# Patient Record
Sex: Male | Born: 1970 | Race: White | Hispanic: No | Smoking: Never smoker
Health system: Southern US, Community
[De-identification: ages and names within clinical notes are randomized; demographics above are authoritative.]

## PROBLEM LIST (undated history)

## (undated) DIAGNOSIS — J324 Chronic pansinusitis: Secondary | ICD-10-CM

## (undated) DIAGNOSIS — J342 Deviated nasal septum: Secondary | ICD-10-CM

## (undated) DIAGNOSIS — K219 Gastro-esophageal reflux disease without esophagitis: Secondary | ICD-10-CM

## (undated) HISTORY — DX: Gastro-esophageal reflux disease without esophagitis: K21.9

## (undated) HISTORY — DX: Chronic pansinusitis: J32.4

## (undated) HISTORY — DX: Deviated nasal septum: J34.2

## (undated) HISTORY — PX: SHOULDER SURGERY: SHX246

---

## 2005-05-29 ENCOUNTER — Ambulatory Visit: Payer: Self-pay | Admitting: Internal Medicine

## 2006-03-02 ENCOUNTER — Encounter: Admission: RE | Admit: 2006-03-02 | Discharge: 2006-03-02 | Payer: Self-pay | Admitting: Internal Medicine

## 2006-03-04 ENCOUNTER — Encounter: Admission: RE | Admit: 2006-03-04 | Discharge: 2006-03-04 | Payer: Self-pay | Admitting: Internal Medicine

## 2009-10-19 ENCOUNTER — Encounter: Payer: Self-pay | Admitting: Internal Medicine

## 2010-05-24 NOTE — Letter (Signed)
Summary: [facility]  [facility]   Imported By: Sherian Rein 10/21/2009 14:33:05  _____________________________________________________________________  External Attachment:    Type:   Image     Comment:   External Document

## 2018-06-11 ENCOUNTER — Other Ambulatory Visit: Payer: Self-pay | Admitting: Otolaryngology

## 2018-06-11 DIAGNOSIS — J329 Chronic sinusitis, unspecified: Secondary | ICD-10-CM

## 2018-06-12 ENCOUNTER — Ambulatory Visit
Admission: EM | Admit: 2018-06-12 | Discharge: 2018-06-12 | Disposition: A | Discharge status: Home / Self Care | Payer: BLUE CROSS/BLUE SHIELD | Attending: Family Medicine | Admitting: Family Medicine

## 2018-06-12 DIAGNOSIS — M940 Chondrocostal junction syndrome [Tietze]: Secondary | ICD-10-CM

## 2018-06-12 DIAGNOSIS — R079 Chest pain, unspecified: Secondary | ICD-10-CM | POA: Diagnosis not present

## 2018-06-12 DIAGNOSIS — R1013 Epigastric pain: Secondary | ICD-10-CM

## 2018-06-12 MED ORDER — DICLOFENAC SODIUM 75 MG PO TBEC: 75.0000 mg | DELAYED_RELEASE_TABLET | Freq: Two times a day (BID) | ORAL | 0 refills | Status: DC

## 2018-06-12 NOTE — ED Triage Notes (Signed)
Pt c/o chest pain x1 wk after heavy lifting, c/o center chest burning some now with relief after belching.

## 2018-06-13 NOTE — ED Provider Notes (Signed)
[place]   [id number] 06/12/18 Arrival Time: 2353  ASSESSMENT & PLAN:  1. Chest pain, unspecified type   2. Costochondritis   3. Dyspepsia    No suspicion for cardiac etiology of current symptoms. Discussed.  ECG: Performed today and interpreted by me: normal EKG, normal sinus rhythm. No STEMI.  Trial of: Meds ordered this encounter  Medications  . diclofenac (VOLTAREN) 75 MG EC tablet    Sig: Take 1 tablet (75 mg total) by mouth 2 (two) times daily.    Dispense:  14 tablet    Refill:  0   Recommend taking OTC Pepcid 20mg  BID for the next 1-2 weeks if his dyspeptic symptoms worsen. Not bothering him too much now.  Chest pain precautions discussed. Reviewed expectations re: course of current medical issues. Questions answered. Outlined signs and symptoms indicating need for more acute intervention. Patient verbalized understanding. After Visit Summary given.   SUBJECTIVE: History from: patient.  Seth Gonzales is a 48 y.o. male who presents with complaint of intermittent anterior chest discomfort described as dull with occasional sharp exacerbations that does not radiate. Onset gradual, over the past week with unchanged course since that time. Has been doing renovation work at his house; much lifting; questions strain of chest wall. No trauma. Rates as 3/10 in intensity when present; without associated n/v; without associated SOB. Occasional epigastric discomfort after eating. Resolves after belching. This is a long standing problem but doesn't bother him too much. Typical duration of symptoms when present: seconds then resolves. Denies: chest pressure/discomfort, claudication, dyspnea, exertional chest pressure/discomfort, fatigue, irregular heart beat, lower extremity edema, near-syncope, orthopnea, palpitations, paroxysmal nocturnal dyspnea, syncope and tachypnea. Aggravating factors: include certain movements of torso. Alleviating factors: rest. Recent  illnesses: none. Fever: absent. Ambulatory without assistance. Self/OTC treatment: none reported. History of similar: no. Illicit drug use: none. Reports that he has never smoked. He has never used smokeless tobacco.  ROS: As per HPI. All other systems negative.   OBJECTIVE:  Vitals:   06/12/18 1716  BP: (!) 151/83  Pulse: 94  Resp: 18  Temp: 97.9 F (36.6 C)  TempSrc: Oral  SpO2: 98%    General appearance: alert, oriented, no acute distress Eyes: PERRLA; EOMI; conjunctivae normal HENT: normocephalic; atraumatic Neck: supple with FROM Lungs: without labored respirations; CTAB Heart: regular rate and rhythm without murmer Chest Wall: with mild bilateral upper tenderness to palpation Abdomen: soft, non-tender; bowel sounds normal; no masses or organomegaly; no guarding or rebound tenderness Extremities: without edema; without calf swelling or tenderness; symmetrical without gross deformities Skin: warm and dry; without rash or lesions Psychological: alert and cooperative; normal mood and affect  Allergies  Allergen Reactions  . Penicillins   . Sulfa Antibiotics     PMH: GERD.  Social History   Socioeconomic History  . Marital status: Married    Spouse name: Not on file  . Number of children: Not on file  . Years of education: Not on file  . Highest education level: Not on file  Occupational History  . Not on file  Social Needs  . Financial resource strain: Not on file  . Food insecurity:    Worry: Not on file    Inability: Not on file  . Transportation needs:    Medical: Not on file    Non-medical: Not on file  Tobacco Use  . Smoking status: Never Smoker  . Smokeless tobacco: Never Used  Substance and Sexual Activity  . Alcohol use: Yes  .  Drug use: Not on file  . Sexual activity: Not on file  Lifestyle  . Physical activity:    Days per week: Not on file    Minutes per session: Not on file  . Stress: Not on file  Relationships  . Social  connections:    Talks on phone: Not on file    Gets together: Not on file    Attends religious service: Not on file    Active member of club or organization: Not on file    Attends meetings of clubs or organizations: Not on file    Relationship status: Not on file  . Intimate partner violence:    Fear of current or ex partner: Not on file    Emotionally abused: Not on file    Physically abused: Not on file    Forced sexual activity: Not on file  Other Topics Concern  . Not on file  Social History Narrative  . Not on file   FH: Question of HTN.  History reviewed. No pertinent surgical history.   Vanessa Kick, MD 06/18/18 0900

## 2018-06-14 ENCOUNTER — Ambulatory Visit
Admission: RE | Admit: 2018-06-14 | Discharge: 2018-06-14 | Disposition: A | Discharge status: Home / Self Care | Payer: BLUE CROSS/BLUE SHIELD | Source: Ambulatory Visit | Attending: Otolaryngology | Admitting: Otolaryngology

## 2018-06-14 DIAGNOSIS — J329 Chronic sinusitis, unspecified: Secondary | ICD-10-CM

## 2019-02-22 ENCOUNTER — Encounter (INDEPENDENT_AMBULATORY_CARE_PROVIDER_SITE_OTHER): Payer: Self-pay

## 2019-10-28 ENCOUNTER — Other Ambulatory Visit: Payer: Self-pay

## 2019-10-28 ENCOUNTER — Encounter (INDEPENDENT_AMBULATORY_CARE_PROVIDER_SITE_OTHER): Payer: Self-pay | Admitting: Otolaryngology

## 2019-10-28 ENCOUNTER — Ambulatory Visit (INDEPENDENT_AMBULATORY_CARE_PROVIDER_SITE_OTHER): Payer: Self-pay | Admitting: Otolaryngology

## 2019-10-28 VITALS — Temp 97.5°F

## 2019-10-28 DIAGNOSIS — J343 Hypertrophy of nasal turbinates: Secondary | ICD-10-CM

## 2019-10-28 DIAGNOSIS — J342 Deviated nasal septum: Secondary | ICD-10-CM

## 2019-10-28 DIAGNOSIS — J324 Chronic pansinusitis: Secondary | ICD-10-CM

## 2019-10-28 NOTE — Progress Notes (Signed)
HPI: Seth Gonzales is a 49 y.o. male who returns today for evaluation of sinuses.  He was previously scheduled for sinus surgery, septoplasty and turbinate reductions in April of last year when Covid hit and surgery was canceled.  He presents today to discuss rescheduling surgery.  His main complaints seem to be chronic drainage as well as some pressure between the eyes and in the head as well as nasal congestion especially at night when he lies down. His previous CT scan of the sinuses was performed in February of last year.Marland Kitchen He is otherwise healthy and on no medications. He has allergies to sulfa medication that causes a rash as well as penicillin.  In the past he has been treated with clindamycin as well as vancomycin for surgeries.  No past medical history on file. No past surgical history on file. Social History   Socioeconomic History  . Marital status: Married    Spouse name: Not on file  . Number of children: Not on file  . Years of education: Not on file  . Highest education level: Not on file  Occupational History  . Not on file  Tobacco Use  . Smoking status: Never Smoker  . Smokeless tobacco: Never Used  Substance and Sexual Activity  . Alcohol use: Yes  . Drug use: Not on file  . Sexual activity: Not on file  Other Topics Concern  . Not on file  Social History Narrative  . Not on file   Social Determinants of Health   Financial Resource Strain:   . Difficulty of Paying Living Expenses:   Food Insecurity:   . Worried About Charity fundraiser in the Last Year:   . Arboriculturist in the Last Year:   Transportation Needs:   . Film/video editor (Medical):   Marland Kitchen Lack of Transportation (Non-Medical):   Physical Activity:   . Days of Exercise per Week:   . Minutes of Exercise per Session:   Stress:   . Feeling of Stress :   Social Connections:   . Frequency of Communication with Friends and Family:   . Frequency of Social Gatherings with Friends and  Family:   . Attends Religious Services:   . Active Member of Clubs or Organizations:   . Attends Archivist Meetings:   Marland Kitchen Marital Status:    No family history on file. Allergies  Allergen Reactions  . Penicillins   . Sulfa Antibiotics    Prior to Admission medications   Medication Sig Start Date End Date Taking? Authorizing Provider  diclofenac (VOLTAREN) 75 MG EC tablet Take 1 tablet (75 mg total) by mouth 2 (two) times daily. 06/12/18  Yes Vanessa Kick, MD     Positive ROS: Otherwise negative  All other systems have been reviewed and were otherwise negative with the exception of those mentioned in the HPI and as above.  Physical Exam: Constitutional: Alert, well-appearing, no acute distress Ears: External ears without lesions or tenderness. Ear canals are clear bilaterally with intact, clear TMs.  Nasal: External nose without lesions. Septum with mild to moderate deviation..  Moderate mucosal edema on both sides. Nasal endoscopy was performed in the office today nasal endoscopy the left side is very edematous in the middle meatus with questionable polypoid changes.  The right side also is very edematous with purulent discharge from the right middle meatus.  Septum slightly more deviated to the left. Oral: Lips and gums without lesions. Tongue and palate mucosa  without lesions. Posterior oropharynx clear. Neck: No palpable adenopathy or masses Respiratory: Breathing comfortably Lungs: Clear to auscultation Cardiac exam: Regular rate and rhythm without murmur Skin: No facial/neck lesions or rash noted.  Nasal/sinus endoscopy  Date/Time: 10/28/2019 5:21 PM Performed by: Rozetta Nunnery, MD Authorized by: Rozetta Nunnery, MD   Consent:    Consent obtained:  Verbal   Consent given by:  Patient Procedure details:    Indications: sino-nasal symptoms     Medication:  Afrin   Instrument: flexible fiberoptic nasal endoscope     Scope location: bilateral nare    Septum:    Deviation: deviated to the left     Severity of deviation: intermediate   Sinus:    inflammation and purulence     obstruction   Comments:     On nasal endoscopy has very edematous nasal mucosa.  The middle meatus is almost shut on the left side with questionable polypoid changes.  On the right side he has a purulent discharge from the right middle meatus which is also very edematous and obstructed.    Assessment: Septal deviation with turbinate hypertrophy.  Chronic sinus disease. Nasal obstruction.  Plan: Reviewed with patient concerning repeat CT scan fusion protocol. Following the CT scan we will plan on scheduling surgery and reviewed this with him consisting of septoplasty, turbinate reductions and bilateral FESS.   Radene Journey, MD

## 2019-10-29 ENCOUNTER — Other Ambulatory Visit (INDEPENDENT_AMBULATORY_CARE_PROVIDER_SITE_OTHER): Payer: Self-pay

## 2019-10-29 DIAGNOSIS — J329 Chronic sinusitis, unspecified: Secondary | ICD-10-CM

## 2020-02-23 DIAGNOSIS — U071 COVID-19: Secondary | ICD-10-CM

## 2020-02-23 HISTORY — DX: COVID-19: U07.1

## 2020-03-21 ENCOUNTER — Emergency Department (HOSPITAL_COMMUNITY): Payer: HRSA Program

## 2020-03-21 ENCOUNTER — Encounter (HOSPITAL_COMMUNITY): Payer: Self-pay | Admitting: Emergency Medicine

## 2020-03-21 ENCOUNTER — Other Ambulatory Visit: Payer: Self-pay

## 2020-03-21 ENCOUNTER — Emergency Department (HOSPITAL_COMMUNITY)
Admission: EM | Admit: 2020-03-21 | Discharge: 2020-03-22 | Disposition: A | Discharge status: Home / Self Care | Payer: HRSA Program | Attending: Emergency Medicine | Admitting: Emergency Medicine

## 2020-03-21 DIAGNOSIS — U071 COVID-19: Secondary | ICD-10-CM | POA: Insufficient documentation

## 2020-03-21 DIAGNOSIS — R059 Cough, unspecified: Secondary | ICD-10-CM | POA: Diagnosis present

## 2020-03-21 DIAGNOSIS — J1282 Pneumonia due to coronavirus disease 2019: Secondary | ICD-10-CM | POA: Insufficient documentation

## 2020-03-21 NOTE — ED Triage Notes (Signed)
Patient reports positive covid test at home on 11/22. Reports continued cough and fatigue.

## 2020-03-22 ENCOUNTER — Encounter (HOSPITAL_COMMUNITY): Payer: Self-pay | Admitting: Student

## 2020-03-22 LAB — RESP PANEL BY RT-PCR (FLU A&B, COVID) ARPGX2
Influenza A by PCR: NEGATIVE
Influenza B by PCR: NEGATIVE
SARS Coronavirus 2 by RT PCR: POSITIVE — AB

## 2020-03-22 MED ORDER — DIPHENHYDRAMINE HCL 50 MG/ML IJ SOLN: 50.0000 mg | Freq: Once | INTRAMUSCULAR | Status: DC | PRN

## 2020-03-22 MED ORDER — IMDEVIMAB (REGN 10987) INJECTION: 1200.0000 mg | Freq: Once | INTRAMUSCULAR | Status: DC

## 2020-03-22 MED ORDER — HYDROCOD POLST-CPM POLST ER 10-8 MG/5ML PO SUER: 5.0000 mL | Freq: Two times a day (BID) | ORAL | 0 refills | Status: DC | PRN

## 2020-03-22 MED ORDER — HYDROCOD POLST-CPM POLST ER 10-8 MG/5ML PO SUER
5.0000 mL | Freq: Once | ORAL | Status: AC
  Administered 2020-03-22: 5 mL via ORAL
  Filled 2020-03-22: qty 5

## 2020-03-22 MED ORDER — SODIUM CHLORIDE 0.9 % IV SOLN: INTRAVENOUS | Status: DC | PRN

## 2020-03-22 MED ORDER — ALBUTEROL SULFATE HFA 108 (90 BASE) MCG/ACT IN AERS: 2.0000 | INHALATION_SPRAY | RESPIRATORY_TRACT | Status: DC | PRN

## 2020-03-22 MED ORDER — FAMOTIDINE IN NACL 20-0.9 MG/50ML-% IV SOLN: 20.0000 mg | Freq: Once | INTRAVENOUS | Status: DC | PRN

## 2020-03-22 MED ORDER — AEROCHAMBER Z-STAT PLUS/MEDIUM MISC: 1.0000 | Freq: Once | Status: DC

## 2020-03-22 MED ORDER — EPINEPHRINE 0.3 MG/0.3ML IJ SOAJ: 0.3000 mg | Freq: Once | INTRAMUSCULAR | Status: DC | PRN

## 2020-03-22 MED ORDER — METHYLPREDNISOLONE SODIUM SUCC 125 MG IJ SOLR: 125.0000 mg | Freq: Once | INTRAMUSCULAR | Status: DC | PRN

## 2020-03-22 MED ORDER — SOTROVIMAB 500 MG/8ML IV SOLN
500.0000 mg | Freq: Once | INTRAVENOUS | Status: DC
  Filled 2020-03-22: qty 8

## 2020-03-22 MED ORDER — ALBUTEROL SULFATE HFA 108 (90 BASE) MCG/ACT IN AERS: 2.0000 | INHALATION_SPRAY | Freq: Once | RESPIRATORY_TRACT | Status: DC | PRN

## 2020-03-22 NOTE — ED Provider Notes (Signed)
[facility] DEPT Provider Note: Georgena Spurling, MD, FACEP  CSN: [id number] MRN: [id number] ARRIVAL: 03/21/20 at 2005 ROOM: WA09/WA09   CHIEF COMPLAINT  Cough   HISTORY OF PRESENT ILLNESS  03/22/20 12:08 AM Seth Gonzales is a 49 y.o. male with cough, fever to 101, loss of taste and smell, body aches and fatigue. He denies change in chronic nasal congestion, sore throat, vomiting or diarrhea. He has had nausea. Symptoms are mild to moderate. No significant change with DayQuil and Mucinex. His biggest concern is the persistent. He is not having shortness of breath or hypoxia. He tested positive for Covid at home.  He is not vaccinated against Covid-19.   History reviewed. No pertinent past medical history.  History reviewed. No pertinent surgical history.  History reviewed. No pertinent family history.  Social History   Tobacco Use  . Smoking status: Never Smoker  . Smokeless tobacco: Never Used  Substance Use Topics  . Alcohol use: Yes  . Drug use: Not on file    Prior to Admission medications   Medication Sig Start Date End Date Taking? Authorizing Provider  chlorpheniramine-HYDROcodone (TUSSIONEX PENNKINETIC ER) 10-8 MG/5ML SUER Take 5 mLs by mouth every 12 (twelve) hours as needed for cough. 03/22/20   Jameah Rouser, MD  diclofenac (VOLTAREN) 75 MG EC tablet Take 1 tablet (75 mg total) by mouth 2 (two) times daily. 06/12/18   Vanessa Kick, MD    Allergies Penicillins and Sulfa antibiotics   REVIEW OF SYSTEMS  Negative except as noted here or in the History of Present Illness.   PHYSICAL EXAMINATION  Initial Vital Signs Blood pressure (!) 122/92, pulse 87, temperature 98.7 F (37.1 C), temperature source Oral, resp. rate 16, SpO2 95 %.  Examination General: Well-developed, well-nourished male in no acute distress; appearance consistent with age of record HENT: normocephalic; atraumatic Eyes: pupils equal, round and reactive to light; extraocular  muscles intact Neck: supple Heart: regular rate and rhythm; no murmurs, rubs or gallops Lungs: clear to auscultation bilaterally Abdomen: soft; nondistended; nontender; no masses or hepatosplenomegaly; bowel sounds present Extremities: No deformity; full range of motion; pulses normal Neurologic: Awake, alert and oriented; motor function intact in all extremities and symmetric; no facial droop Skin: Warm and dry Psychiatric: Normal mood and affect   RESULTS  Summary of this visit's results, reviewed and interpreted by myself:   EKG Interpretation  Date/Time:    Ventricular Rate:    PR Interval:    QRS Duration:   QT Interval:    QTC Calculation:   R Axis:     Text Interpretation:        Laboratory Studies: No results found for this or any previous visit (from the past 24 hour(s)). Imaging Studies: DG Chest 2 View  Result Date: 03/21/2020 CLINICAL DATA:  Positive home COVID test with cough and fatigue. EXAM: CHEST - 2 VIEW COMPARISON:  None. FINDINGS: Very mild areas of atelectasis and/or infiltrate are seen within the lateral aspect of the bilateral lung bases. There is no evidence of a pleural effusion or pneumothorax. The heart size and mediastinal contours are within normal limits. The visualized skeletal structures are unremarkable. IMPRESSION: Very mild bibasilar atelectasis and/or infiltrate. Electronically Signed   By: Virgina Norfolk M.D.   On: 03/21/2020 21:00    ED COURSE and MDM  Nursing notes, initial and subsequent vitals signs, including pulse oximetry, reviewed and interpreted by myself.  Vitals:   03/21/20 2026 03/21/20 2306  BP: (!) 142/92 Marland Kitchen)  122/92  Pulse: (!) 104 87  Resp: 20 16  Temp: 98.9 F (37.2 C) 98.7 F (37.1 C)  TempSrc: Oral Oral  SpO2: 94% 95%   Medications  chlorpheniramine-HYDROcodone (TUSSIONEX) 10-8 MG/5ML suspension 5 mL (has no administration in time range)  albuterol (VENTOLIN HFA) 108 (90 Base) MCG/ACT inhaler 2 puff (has no  administration in time range)    Although the patient does qualify for the monoclonal antibody infusion after informed consent he declined.  We will treat his symptoms.  He he is free to return should he change his mind.  PROCEDURES  Procedures   ED DIAGNOSES     ICD-10-CM   1. Pneumonia due to COVID-19 virus  U07.1    J12.82        Manases Etchison, Jenny Reichmann, MD 03/22/20 [phone]

## 2020-04-30 ENCOUNTER — Other Ambulatory Visit: Payer: Self-pay

## 2020-04-30 ENCOUNTER — Encounter (INDEPENDENT_AMBULATORY_CARE_PROVIDER_SITE_OTHER): Payer: Self-pay | Admitting: Otolaryngology

## 2020-04-30 ENCOUNTER — Ambulatory Visit (INDEPENDENT_AMBULATORY_CARE_PROVIDER_SITE_OTHER): Payer: Managed Care, Other (non HMO) | Admitting: Otolaryngology

## 2020-04-30 VITALS — Temp 97.9°F

## 2020-04-30 DIAGNOSIS — J329 Chronic sinusitis, unspecified: Secondary | ICD-10-CM

## 2020-04-30 DIAGNOSIS — J339 Nasal polyp, unspecified: Secondary | ICD-10-CM | POA: Diagnosis not present

## 2020-04-30 NOTE — Progress Notes (Signed)
HPI: Seth Gonzales is a 50 y.o. male who returns today for evaluation of chronic nasal sinus symptoms.  His main problem seems to be nasal congestion where he has trouble breathing through his nose.  He also complains of excessive amount of mucus discharge from his nose.  He does not notice a lot of infections or pain or pressure but mostly just chronic drainage thick is persistent and chronic nasal congestion where he has trouble breathing through his nose.  On previous examination he has sinonasal polyps and had a CT scan performed 2 years ago that showed complete opacification of the ethmoid and frontal sinuses as well as the maxillary sinuses on both sides.  The last CT scan was performed in February 2020.Marland Kitchen He is otherwise healthy and does not take any regular medications.  He does have allergies to sulfa and penicillin.  Apparently had a bad reaction to penicillin for orthopedic surgery where his throat "closed up".  He is okay taking vancomycin as well as clindamycin.  He also has reaction to sulfa medication.  No past medical history on file. No past surgical history on file. Social History   Socioeconomic History  . Marital status: Married    Spouse name: Not on file  . Number of children: Not on file  . Years of education: Not on file  . Highest education level: Not on file  Occupational History  . Not on file  Tobacco Use  . Smoking status: Never Smoker  . Smokeless tobacco: Never Used  Substance and Sexual Activity  . Alcohol use: Yes  . Drug use: Not on file  . Sexual activity: Not on file  Other Topics Concern  . Not on file  Social History Narrative  . Not on file   Social Determinants of Health   Financial Resource Strain: Not on file  Food Insecurity: Not on file  Transportation Needs: Not on file  Physical Activity: Not on file  Stress: Not on file  Social Connections: Not on file   No family history on file. Allergies  Allergen Reactions  . Penicillins    . Sulfa Antibiotics    Prior to Admission medications   Medication Sig Start Date End Date Taking? Authorizing Provider  chlorpheniramine-HYDROcodone (TUSSIONEX PENNKINETIC ER) 10-8 MG/5ML SUER Take 5 mLs by mouth every 12 (twelve) hours as needed for cough. 03/22/20   Molpus, John, MD  diclofenac (VOLTAREN) 75 MG EC tablet Take 1 tablet (75 mg total) by mouth 2 (two) times daily. 06/12/18   Vanessa Kick, MD     Positive ROS: Otherwise negative  All other systems have been reviewed and were otherwise negative with the exception of those mentioned in the HPI and as above.  Physical Exam: Constitutional: Alert, well-appearing, no acute distress Ears: External ears without lesions or tenderness. Ear canals are clear bilaterally with intact, clear TMs.  Nasal: External nose without lesions. Septum deviated to the left..  On anterior rhinoscopy he has bilateral middle meatal polyps and congestion and swelling.  He has thick mucus discharge on both sides. Oral: Lips and gums without lesions. Tongue and palate mucosa without lesions. Posterior oropharynx clear. Neck: No palpable adenopathy or masses Respiratory: Breathing comfortably Cardiac exam: Regular rate and rhythm without murmur. Skin: No facial/neck lesions or rash noted.  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  Plan: I discussed with patient today concerning surgical intervention which he is interested in. We will  have to obtain an up-to-date CT scan which is fusion protocol as he will require septoplasty turbinate reduction as well as bilateral endoscopic sinus surgery in order to remove the sinonasal polyps with fusion protocol to be safe.  Reviewed the surgery with him.  He will require overnight nasal packing and follow-up in the office the following day to have nasal packing removed.   Radene Journey, MD

## 2020-05-04 ENCOUNTER — Other Ambulatory Visit (INDEPENDENT_AMBULATORY_CARE_PROVIDER_SITE_OTHER): Payer: Self-pay

## 2020-05-04 DIAGNOSIS — J329 Chronic sinusitis, unspecified: Secondary | ICD-10-CM

## 2020-05-19 ENCOUNTER — Ambulatory Visit
Admission: RE | Admit: 2020-05-19 | Discharge: 2020-05-19 | Disposition: A | Discharge status: Home / Self Care | Payer: Managed Care, Other (non HMO) | Source: Ambulatory Visit | Attending: Otolaryngology | Admitting: Otolaryngology

## 2020-05-19 DIAGNOSIS — J329 Chronic sinusitis, unspecified: Secondary | ICD-10-CM

## 2020-06-30 ENCOUNTER — Telehealth: Payer: Self-pay

## 2020-06-30 ENCOUNTER — Other Ambulatory Visit: Payer: Self-pay

## 2020-06-30 DIAGNOSIS — Z1211 Encounter for screening for malignant neoplasm of colon: Secondary | ICD-10-CM

## 2020-06-30 NOTE — Telephone Encounter (Signed)
Seth Gonzales is a direct colonoscopy for Dr Silvano Rusk. Instructions explained to patient.

## 2020-07-13 ENCOUNTER — Ambulatory Visit
Admission: EM | Admit: 2020-07-13 | Discharge: 2020-07-13 | Disposition: A | Discharge status: Home / Self Care | Payer: Managed Care, Other (non HMO) | Attending: Emergency Medicine | Admitting: Emergency Medicine

## 2020-07-13 ENCOUNTER — Encounter: Payer: Self-pay | Admitting: Emergency Medicine

## 2020-07-13 ENCOUNTER — Other Ambulatory Visit: Payer: Self-pay

## 2020-07-13 ENCOUNTER — Ambulatory Visit (INDEPENDENT_AMBULATORY_CARE_PROVIDER_SITE_OTHER): Payer: Managed Care, Other (non HMO)

## 2020-07-13 DIAGNOSIS — R0781 Pleurodynia: Secondary | ICD-10-CM

## 2020-07-13 DIAGNOSIS — S2232XA Fracture of one rib, left side, initial encounter for closed fracture: Secondary | ICD-10-CM

## 2020-07-13 MED ORDER — HYDROCODONE-ACETAMINOPHEN 5-325 MG PO TABS: 1.0000 | ORAL_TABLET | Freq: Four times a day (QID) | ORAL | 0 refills | Status: DC | PRN

## 2020-07-13 MED ORDER — TIZANIDINE HCL 4 MG PO TABS: 4.0000 mg | ORAL_TABLET | Freq: Three times a day (TID) | ORAL | 0 refills | Status: DC | PRN

## 2020-07-13 MED ORDER — IBUPROFEN 600 MG PO TABS: 600.0000 mg | ORAL_TABLET | Freq: Four times a day (QID) | ORAL | 0 refills | Status: DC | PRN

## 2020-07-13 NOTE — Discharge Instructions (Signed)
It looks like you have a nondisplaced rib fracture of the sixth rib.  You can take a Tylenol containing product with 600 mg ibuprofen 3-4 times a day as needed for pain.  Either 1000 mg of Tylenol with the ibuprofen for mild to moderate pain or 1-2 Norco with the ibuprofen for severe pain.  Zanaflex for muscle spasms.  This will take 6 to 8 weeks to fully resolve but she should be feeling better in several weeks.  Limit activity.

## 2020-07-13 NOTE — ED Triage Notes (Signed)
Pt here for left sided rib pain into back of shoulder blade that starting while using post hole diggers and hitting something very hard per pt; pt sts pain with movement at present

## 2020-07-13 NOTE — ED Provider Notes (Signed)
HPI  SUBJECTIVE:  Seth Gonzales is a 50 y.o. male who presents with left-sided chest pain described as constant soreness starting earlier this afternoon.  It was initially stabbing. patient states he was digging a hole with a post digger and hit something hard and the post digger recoiled.  He denies direct trauma to the chest.  He states that he felt something "give" followed by pain.  He states that the pain starts in his sternum and wraps around his left side and goes to his back.  No chest pressure, heaviness, nausea, diaphoresis, cough, wheeze, shortness of breath.  Denies injury to the left shoulder.  He has not tried anything for this.  No alleviating factors.  Symptoms are worse with torso rotation to the left and turning his neck to the left.  Past medical history negative for diabetes, hypertension, MI, hypercholesterolemia, pneumothorax, osteoporosis, smoking.  PMD: He plans to reestablish care at [place].    Past Medical History:  Diagnosis Date  . Chronic pansinusitis   . Nasal septal deviation     Past Surgical History:  Procedure Laterality Date  . SHOULDER SURGERY Right    cleared out bursitis, infected cyst    Family History  Problem Relation Age of Onset  . Pancreatic cancer Mother   . Colon polyps Father   . Diabetes Father   . Cirrhosis Other        Pat. side, heavy alcohol use    Social History   Tobacco Use  . Smoking status: Never Smoker  . Smokeless tobacco: Never Used  Vaping Use  . Vaping Use: Never used  Substance Use Topics  . Alcohol use: Yes  . Drug use: Never    No current facility-administered medications for this encounter.  Current Outpatient Medications:  .  HYDROcodone-acetaminophen (NORCO/VICODIN) 5-325 MG tablet, Take 1-2 tablets by mouth every 6 (six) hours as needed for moderate pain or severe pain., Disp: 12 tablet, Rfl: 0 .  ibuprofen (ADVIL) 600 MG tablet, Take 1 tablet (600 mg total) by mouth every 6 (six) hours  as needed., Disp: 30 tablet, Rfl: 0  Allergies  Allergen Reactions  . Penicillins   . Sulfa Antibiotics      ROS  As noted in HPI.   Physical Exam  BP (!) 144/94 (BP Location: Left Arm)   Pulse 75   Temp 98 F (36.7 C) (Oral)   Resp 20   SpO2 97%   Constitutional: Well developed, well nourished, no acute distress Eyes:  EOMI, conjunctiva normal bilaterally HENT: Normocephalic, atraumatic,mucus membranes moist Respiratory: Limited inspiratory effort, lungs clear bilaterally.  Positive left fifth/6th rib tenderness at the costochondral junction and along the entire lrib to the midaxillary line.  No crepitus.  Pain aggravated with torso rotation to the left..  No other chest wall tenderness. Cardiovascular: Normal rate regular rhythm, no murmurs rubs or gallops GI: nondistended skin: No rash, skin intact Musculoskeletal: No T-spine tenderness.  No tenderness over the left shoulder, over entire back or rest of the rib cage Neurologic: Alert & oriented x 3, no focal neuro deficits Psychiatric: Speech and behavior appropriate   ED Course   Medications - No data to display  Orders Placed This Encounter  Procedures  . DG Ribs Unilateral W/Chest Left    Standing Status:   Standing    Number of Occurrences:   1    Order Specific Question:   Reason for Exam (SYMPTOM  OR DIAGNOSIS REQUIRED)  Answer:   5th rib tenderness r/o fx, displacement/dislocation    No results found for this or any previous visit (from the past 24 hour(s)). DG Ribs Unilateral W/Chest Left  Result Date: 07/13/2020 CLINICAL DATA:  Fifth rib tenderness EXAM: LEFT RIBS AND CHEST - 3+ VIEW COMPARISON:  03/21/2020 FINDINGS: No fracture or other bone lesions are seen involving the ribs. There is no evidence of pneumothorax or pleural effusion. Left basilar atelectasis/scarring. Heart size and mediastinal contours are within normal limits. IMPRESSION: No significant abnormality of the left ribs. Electronically  Signed   By: Macy Mis M.D.   On: 07/13/2020 21:03    ED Clinical Impression  1. Closed traumatic nondisplaced fracture of one rib of left side, initial encounter      ED Assessment/Plan  Presentation consistent with musculoskeletal chest pain.  Will obtain left-sided rib films to rule out fracture, rib displacement/dislocation.  Doubt cardiac etiology.  Reviewed imaging independently and discussed with radiology.  Likely nondisplaced fracture of the left sixth rib per reread from radiology.  Initially read as negative.  Plan to send home with Tylenol-containing product/ibuprofen, Zanaflex.  He will follow up with his PMD at [facility].  ER if he gets worse.  Work note for [employer] duty.  Discussed  imaging, MDM, treatment plan, and plan for follow-up with patient. Discussed sn/sx that should prompt return to the ED. patient agrees with plan.   Meds ordered this encounter  Medications  . HYDROcodone-acetaminophen (NORCO/VICODIN) 5-325 MG tablet    Sig: Take 1-2 tablets by mouth every 6 (six) hours as needed for moderate pain or severe pain.    Dispense:  12 tablet    Refill:  0  . ibuprofen (ADVIL) 600 MG tablet    Sig: Take 1 tablet (600 mg total) by mouth every 6 (six) hours as needed.    Dispense:  30 tablet    Refill:  0    *This clinic note was created using [employer]. Therefore, there may be occasional mistakes despite careful proofreading.   ?    Melynda Ripple, MD 07/14/20 [phone]

## 2020-08-12 ENCOUNTER — Other Ambulatory Visit (INDEPENDENT_AMBULATORY_CARE_PROVIDER_SITE_OTHER): Payer: Self-pay | Admitting: Otolaryngology

## 2020-08-12 DIAGNOSIS — J342 Deviated nasal septum: Secondary | ICD-10-CM | POA: Diagnosis not present

## 2020-08-12 DIAGNOSIS — J343 Hypertrophy of nasal turbinates: Secondary | ICD-10-CM | POA: Diagnosis not present

## 2020-08-12 DIAGNOSIS — J329 Chronic sinusitis, unspecified: Secondary | ICD-10-CM | POA: Diagnosis not present

## 2020-08-12 DIAGNOSIS — J320 Chronic maxillary sinusitis: Secondary | ICD-10-CM | POA: Diagnosis not present

## 2020-08-12 HISTORY — PX: NASAL SINUS SURGERY: SHX719

## 2020-08-12 MED ORDER — CEPHALEXIN 500 MG PO CAPS: 500.0000 mg | ORAL_CAPSULE | Freq: Two times a day (BID) | ORAL | 0 refills | Status: DC

## 2020-08-12 MED ORDER — HYDROCODONE-ACETAMINOPHEN 5-325 MG PO TABS: 1.0000 | ORAL_TABLET | Freq: Four times a day (QID) | ORAL | 0 refills | Status: DC | PRN

## 2020-08-13 ENCOUNTER — Other Ambulatory Visit: Payer: Self-pay

## 2020-08-13 ENCOUNTER — Encounter (INDEPENDENT_AMBULATORY_CARE_PROVIDER_SITE_OTHER): Payer: Self-pay | Admitting: Otolaryngology

## 2020-08-13 ENCOUNTER — Ambulatory Visit (INDEPENDENT_AMBULATORY_CARE_PROVIDER_SITE_OTHER): Payer: Managed Care, Other (non HMO) | Admitting: Otolaryngology

## 2020-08-13 VITALS — Temp 97.7°F

## 2020-08-13 DIAGNOSIS — Z4889 Encounter for other specified surgical aftercare: Secondary | ICD-10-CM

## 2020-08-13 NOTE — Progress Notes (Signed)
HPI: Seth Gonzales is a 50 y.o. male who presents 1 days s/p septoplasty, turbinate reductions and FESS.  He has done well with mild bleeding.  He had some bleeding from the right tear duct.  Presents today to have nasal packs removed..   Past Medical History:  Diagnosis Date  . Chronic pansinusitis   . Nasal septal deviation    Past Surgical History:  Procedure Laterality Date  . SHOULDER SURGERY Right    cleared out bursitis, infected cyst   Social History   Socioeconomic History  . Marital status: Divorced    Spouse name: Not on file  . Number of children: 0  . Years of education: Not on file  . Highest education level: Not on file  Occupational History  . 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 Smoking status: Never Smoker  . Smokeless tobacco: Never Used  Vaping Use  . Vaping Use: Never used  Substance and Sexual Activity  . Alcohol use: Yes  . Drug use: Never  . Sexual activity: Not on file  Other Topics Concern  . Not on file  Social History Narrative  . Not on file   Social Determinants of Health   Financial Resource Strain: Not on file  Food Insecurity: Not on file  Transportation Needs: Not on file  Physical Activity: Not on file  Stress: Not on file  Social Connections: Not on file   Family History  Problem Relation Age of Onset  . Pancreatic cancer Mother   . Colon polyps Father   . Diabetes Father   . Cirrhosis Other        Pat. side, heavy alcohol use   Allergies  Allergen Reactions  . Penicillins   . Sulfa Antibiotics    Prior to Admission medications   Medication Sig Start Date End Date Taking? Authorizing Provider  cephALEXin (KEFLEX) 500 MG capsule Take 1 capsule (500 mg total) by mouth 2 (two) times daily. 08/12/20   Rozetta Nunnery, MD  HYDROcodone-acetaminophen (NORCO/VICODIN) 5-325 MG tablet Take 1-2 tablets by mouth every 6 (six) hours as needed for moderate pain or severe pain. 07/13/20   Melynda Ripple, MD   HYDROcodone-acetaminophen (NORCO/VICODIN) 5-325 MG tablet Take 1-2 tablets by mouth every 6 (six) hours as needed for moderate pain. 08/12/20   Rozetta Nunnery, MD  ibuprofen (ADVIL) 600 MG tablet Take 1 tablet (600 mg total) by mouth every 6 (six) hours as needed. 07/13/20   Melynda Ripple, MD  tiZANidine (ZANAFLEX) 4 MG tablet Take 1 tablet (4 mg total) by mouth every 8 (eight) hours as needed for muscle spasms. 07/13/20   Melynda Ripple, MD     Physical Exam: Nasal packing was removed in the office today.  He had moderate bleeding from the right nostril.   Assessment: S/p septoplasty, turbinate reductions and Fess  Plan: Nasal packing was removed in the office today.  He had moderate bleeding from the right side. He will call me if he has any problems this weekend.  He was instructed to start nasal saline irrigations tomorrow.Marland Kitchen He will complete the remaining antibiotics. He will follow-up in my office in 10 days for recheck.   Radene Journey, MD

## 2020-08-22 HISTORY — PX: COLONOSCOPY W/ POLYPECTOMY: SHX1380

## 2020-08-23 ENCOUNTER — Encounter: Payer: Self-pay | Admitting: Internal Medicine

## 2020-08-23 ENCOUNTER — Encounter (INDEPENDENT_AMBULATORY_CARE_PROVIDER_SITE_OTHER): Payer: Managed Care, Other (non HMO) | Admitting: Otolaryngology

## 2020-08-23 ENCOUNTER — Other Ambulatory Visit: Payer: Self-pay | Admitting: Internal Medicine

## 2020-08-23 ENCOUNTER — Other Ambulatory Visit: Payer: Self-pay

## 2020-08-23 ENCOUNTER — Ambulatory Visit (AMBULATORY_SURGERY_CENTER): Payer: Managed Care, Other (non HMO) | Admitting: Internal Medicine

## 2020-08-23 VITALS — BP 121/76 | HR 66 | Temp 97.3°F | Resp 13 | Ht 70.0 in | Wt 217.0 lb

## 2020-08-23 DIAGNOSIS — D123 Benign neoplasm of transverse colon: Secondary | ICD-10-CM | POA: Diagnosis not present

## 2020-08-23 DIAGNOSIS — Z1211 Encounter for screening for malignant neoplasm of colon: Secondary | ICD-10-CM | POA: Diagnosis not present

## 2020-08-23 MED ORDER — SODIUM CHLORIDE 0.9 % IV SOLN: 500.0000 mL | Freq: Once | INTRAVENOUS | Status: DC

## 2020-08-23 NOTE — Progress Notes (Signed)
Vs by CW in adm 

## 2020-08-23 NOTE — Patient Instructions (Addendum)
I found and removed 2 tiny polyps that look benign.   I will let you know pathology results and when to have another routine colonoscopy by mail and/or My Chart.  I appreciate the opportunity to care for you. Gatha Mayer, MD, [facility]  Polyp handout given to patient.  Resume previous diet. Continue present medications. Repeat colonoscopy recommended.  Date to be determined after pathology results reviewed.  YOU HAD AN ENDOSCOPIC PROCEDURE TODAY AT [facility]:   Refer to the procedure report that was given to you for any specific questions about what was found during the examination.  If the procedure report does not answer your questions, please call your gastroenterologist to clarify.  If you requested that your care partner not be given the details of your procedure findings, then the procedure report has been included in a sealed envelope for you to review at your convenience later.  YOU SHOULD EXPECT: Some feelings of bloating in the abdomen. Passage of more gas than usual.  Walking can help get rid of the air that was put into your GI tract during the procedure and reduce the bloating. If you had a lower endoscopy (such as a colonoscopy or flexible sigmoidoscopy) you may notice spotting of blood in your stool or on the toilet paper. If you underwent a bowel prep for your procedure, you may not have a normal bowel movement for a few days.  Please Note:  You might notice some irritation and congestion in your nose or some drainage.  This is from the oxygen used during your procedure.  There is no need for concern and it should clear up in a day or so.  SYMPTOMS TO REPORT IMMEDIATELY:   Following lower endoscopy (colonoscopy or flexible sigmoidoscopy):  Excessive amounts of blood in the stool  Significant tenderness or worsening of abdominal pains  Swelling of the abdomen that is new, acute  Fever of 100F or higher For urgent or emergent issues, a gastroenterologist  can be reached at any hour by calling [phone]. Do not use MyChart messaging for urgent concerns.    DIET:  We do recommend a small meal at first, but then you may proceed to your regular diet.  Drink plenty of fluids but you should avoid alcoholic beverages for 24 hours.  ACTIVITY:  You should plan to take it easy for the rest of today and you should NOT DRIVE or use heavy machinery until tomorrow (because of the sedation medicines used during the test).    FOLLOW UP: Our staff will call the number listed on your records 48-72 hours following your procedure to check on you and address any questions or concerns that you may have regarding the information given to you following your procedure. If we do not reach you, we will leave a message.  We will attempt to reach you two times.  During this call, we will ask if you have developed any symptoms of COVID 19. If you develop any symptoms (ie: fever, flu-like symptoms, shortness of breath, cough etc.) before then, please call [phone].  If you test positive for Covid 19 in the 2 weeks post procedure, please call and report this information to [place].    If any biopsies were taken you will be contacted by phone or by letter within the next 1-3 weeks.  Please call us at [phone] if you have not heard about the biopsies in 3 weeks.    SIGNATURES/CONFIDENTIALITY: You and/or your care partner  have signed paperwork which will be entered into your electronic medical record.  These signatures attest to the fact that that the information above on your After Visit Summary has been reviewed and is understood.  Full responsibility of the confidentiality of this discharge information lies with you and/or your care-partner.

## 2020-08-23 NOTE — Progress Notes (Signed)
Pt. D/C's to the care of Melissa on the 3rd floor via W/C, per approval of Charge nurse S. Monday RN.

## 2020-08-23 NOTE — Op Note (Signed)
[place] Patient Name: Seth Gonzales Procedure Date: 08/23/2020 1:49 PM MRN: [id number] Endoscopist: Gatha Mayer , MD Age: 50 Referring MD:  Date of Birth: 12/22/1970 Gender: Male Account #: [id number] Procedure:                Colonoscopy Indications:              Screening for colorectal malignant neoplasm, This                            is the patient's first colonoscopy Medicines:                Propofol per Anesthesia, Monitored Anesthesia Care Procedure:                Pre-Anesthesia Assessment:                           - Prior to the procedure, a History and Physical                            was performed, and patient medications and                            allergies were reviewed. The patient's tolerance of                            previous anesthesia was also reviewed. The risks                            and benefits of the procedure and the sedation                            options and risks were discussed with the patient.                            All questions were answered, and informed consent                            was obtained. Prior Anticoagulants: The patient has                            taken no previous anticoagulant or antiplatelet                            agents. ASA Grade Assessment: II - A patient with                            mild systemic disease. After reviewing the risks                            and benefits, the patient was deemed in                            satisfactory condition to undergo the procedure.  After obtaining informed consent, the colonoscope                            was passed under direct vision. Throughout the                            procedure, the patient's blood pressure, pulse, and                            oxygen saturations were monitored continuously. The                            Olympus CF-HQ190L ([id number]) Colonoscope was                             introduced through the anus and advanced to the the                            cecum, identified by appendiceal orifice and                            ileocecal valve. The colonoscopy was performed                            without difficulty. The patient tolerated the                            procedure well. The quality of the bowel                            preparation was adequate. The bowel preparation                            used was Miralax via split dose instruction. The                            ileocecal valve, appendiceal orifice, and rectum                            were photographed. Scope In: 2:00:41 PM Scope Out: 2:15:54 PM Scope Withdrawal Time: 0 hours 12 minutes 5 seconds  Total Procedure Duration: 0 hours 15 minutes 13 seconds  Findings:                 The perianal and digital rectal examinations were                            normal. Pertinent negatives include normal prostate                            (size, shape, and consistency).                           Two sessile polyps were found in the transverse  colon. The polyps were diminutive in size. These                            polyps were removed with a cold snare. Resection                            and retrieval were complete. Verification of                            patient identification for the specimen was done.                            Estimated blood loss was minimal.                           The exam was otherwise without abnormality on                            direct and retroflexion views. Complications:            No immediate complications. Estimated Blood Loss:     Estimated blood loss was minimal. Impression:               - Two diminutive polyps in the transverse colon,                            removed with a cold snare. Resected and retrieved.                           - The examination was otherwise normal on direct                            and  retroflexion views. Recommendation:           - Patient has a contact number available for                            emergencies. The signs and symptoms of potential                            delayed complications were discussed with the                            patient. Return to normal activities tomorrow.                            Written discharge instructions were provided to the                            patient.                           - Resume previous diet.                           - Continue present medications.                           -  Repeat colonoscopy is recommended. The                            colonoscopy date will be determined after pathology                            results from today's exam become available for                            review. Gatha Mayer, MD 08/23/2020 2:23:32 PM This report has been signed electronically.

## 2020-08-23 NOTE — Progress Notes (Signed)
Called to room to assist during endoscopic procedure.  Patient ID and intended procedure confirmed with present staff. Received instructions for my participation in the procedure from the performing physician.  

## 2020-08-23 NOTE — Progress Notes (Signed)
PT taken to PACU. Monitors in place. VSS. Report given to RN. 

## 2020-08-25 ENCOUNTER — Telehealth: Payer: Self-pay | Admitting: *Deleted

## 2020-08-25 ENCOUNTER — Telehealth: Payer: Self-pay

## 2020-08-25 NOTE — Telephone Encounter (Signed)
  Follow up Call-  Call back number 08/23/2020  Post procedure Call Back phone  # [phone]  Permission to leave phone message Yes  Some recent data might be hidden     Patient questions:  Message left to call us if necessary.  Second call.

## 2020-08-25 NOTE — Telephone Encounter (Signed)
No answer, left message to call back later today, B.Harrol Novello RN. 

## 2020-08-26 ENCOUNTER — Encounter (INDEPENDENT_AMBULATORY_CARE_PROVIDER_SITE_OTHER): Payer: Self-pay | Admitting: Otolaryngology

## 2020-08-26 ENCOUNTER — Other Ambulatory Visit: Payer: Self-pay

## 2020-08-26 ENCOUNTER — Ambulatory Visit (INDEPENDENT_AMBULATORY_CARE_PROVIDER_SITE_OTHER): Payer: Managed Care, Other (non HMO) | Admitting: Otolaryngology

## 2020-08-26 VITALS — Temp 97.2°F

## 2020-08-26 DIAGNOSIS — Z4889 Encounter for other specified surgical aftercare: Secondary | ICD-10-CM

## 2020-08-26 NOTE — Progress Notes (Signed)
HPI: Seth Gonzales is a 50 y.o. male who presents 2 weeks days s/p septoplasty, turbinate reductions and Fess.  He is doing well..   Past Medical History:  Diagnosis Date  . Chronic pansinusitis   . COVID-19 02/2020  . GERD (gastroesophageal reflux disease)   . Nasal septal deviation    Past Surgical History:  Procedure Laterality Date  . NASAL SINUS SURGERY  08/12/2020  . SHOULDER SURGERY Right    cleared out bursitis, infected cyst   Social History   Socioeconomic History  . Marital status: Divorced    Spouse name: Not on file  . Number of children: 0  . Years of education: Not on file  . Highest education level: Not on file  Occupational History  . 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 Smoking status: Never Smoker  . Smokeless tobacco: Never Used  Vaping Use  . Vaping Use: Never used  Substance and Sexual Activity  . Alcohol use: Yes  . Drug use: Never  . Sexual activity: Not on file  Other Topics Concern  . Not on file  Social History Narrative  . Not on file   Social Determinants of Health   Financial Resource Strain: Not on file  Food Insecurity: Not on file  Transportation Needs: Not on file  Physical Activity: Not on file  Stress: Not on file  Social Connections: Not on file   Family History  Problem Relation Age of Onset  . Pancreatic cancer Mother   . Colon polyps Father   . Diabetes Father   . Cirrhosis Other        Pat. side, heavy alcohol use   Allergies  Allergen Reactions  . Penicillins   . Sulfa Antibiotics    Prior to Admission medications   Medication Sig Start Date End Date Taking? Authorizing Provider  cephALEXin (KEFLEX) 500 MG capsule Take 1 capsule (500 mg total) by mouth 2 (two) times daily. 08/12/20   Rozetta Nunnery, MD  HYDROcodone-acetaminophen (NORCO/VICODIN) 5-325 MG tablet Take 1-2 tablets by mouth every 6 (six) hours as needed for moderate pain. 08/12/20   Rozetta Nunnery, MD  ibuprofen (ADVIL)  600 MG tablet Take 1 tablet (600 mg total) by mouth every 6 (six) hours as needed. 07/13/20   Melynda Ripple, MD  tiZANidine (ZANAFLEX) 4 MG tablet Take 1 tablet (4 mg total) by mouth every 8 (eight) hours as needed for muscle spasms. 07/13/20   Melynda Ripple, MD     Physical Exam: He has a mild amount of crusting along the inferior turbinates that was removed.  He also has some crusting in the right middle meatus region as well as some crusting in the left middle meatus region.  This was partially removed.  Nasal passages otherwise clear.   Assessment: S/p septoplasty, turbinate reductions and FESS doing well.  Plan: Recommended continuation with the saline irrigations and will follow up in 2 to 3 weeks for recheck and cleaning.   Radene Journey, MD

## 2020-09-01 ENCOUNTER — Encounter: Payer: Self-pay | Admitting: Internal Medicine

## 2020-09-01 DIAGNOSIS — Z86010 Personal history of colonic polyps: Secondary | ICD-10-CM

## 2020-09-01 DIAGNOSIS — Z860101 Personal history of adenomatous and serrated colon polyps: Secondary | ICD-10-CM

## 2020-09-01 HISTORY — DX: Personal history of colonic polyps: Z86.010

## 2020-09-01 HISTORY — DX: Personal history of adenomatous and serrated colon polyps: Z86.0101

## 2020-09-23 ENCOUNTER — Ambulatory Visit (INDEPENDENT_AMBULATORY_CARE_PROVIDER_SITE_OTHER): Payer: Managed Care, Other (non HMO) | Admitting: Otolaryngology

## 2020-09-23 ENCOUNTER — Other Ambulatory Visit: Payer: Self-pay

## 2020-09-23 VITALS — Temp 97.7°F

## 2020-09-23 DIAGNOSIS — Z4889 Encounter for other specified surgical aftercare: Secondary | ICD-10-CM

## 2020-09-23 NOTE — Progress Notes (Signed)
HPI: Seth Gonzales is a 50 y.o. male who presents 6 weeks  s/p septoplasty, turbinate reductions and FESS.  He is doing well and breathing better.  He states that he may be little bit more sensitive to pollen since he is breathing better..   Past Medical History:  Diagnosis Date  . Chronic pansinusitis   . COVID-19 02/2020  . GERD (gastroesophageal reflux disease)   . Hx of adenomatous colonic polyps 09/01/2020  . Nasal septal deviation    Past Surgical History:  Procedure Laterality Date  . COLONOSCOPY W/ POLYPECTOMY  08/2020  . NASAL SINUS SURGERY  08/12/2020  . SHOULDER SURGERY Right    cleared out bursitis, infected cyst   Social History   Socioeconomic History  . Marital status: Divorced    Spouse name: Not on file  . Number of children: 0  . Years of education: Not on file  . Highest education level: Not on file  Occupational History  . 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 Smoking status: Never Smoker  . Smokeless tobacco: Never Used  Vaping Use  . Vaping Use: Never used  Substance and Sexual Activity  . Alcohol use: Yes  . Drug use: Never  . Sexual activity: Not on file  Other Topics Concern  . Not on file  Social History Narrative  . Not on file   Social Determinants of Health   Financial Resource Strain: Not on file  Food Insecurity: Not on file  Transportation Needs: Not on file  Physical Activity: Not on file  Stress: Not on file  Social Connections: Not on file   Family History  Problem Relation Age of Onset  . Pancreatic cancer Mother   . Colon polyps Father   . Diabetes Father   . Cirrhosis Other        Pat. side, heavy alcohol use   Allergies  Allergen Reactions  . Penicillins   . Sulfa Antibiotics    Prior to Admission medications   Medication Sig Start Date End Date Taking? Authorizing Provider  cephALEXin (KEFLEX) 500 MG capsule Take 1 capsule (500 mg total) by mouth 2 (two) times daily. 08/12/20   Rozetta Nunnery,  MD  HYDROcodone-acetaminophen (NORCO/VICODIN) 5-325 MG tablet Take 1-2 tablets by mouth every 6 (six) hours as needed for moderate pain. 08/12/20   Rozetta Nunnery, MD  ibuprofen (ADVIL) 600 MG tablet Take 1 tablet (600 mg total) by mouth every 6 (six) hours as needed. 07/13/20   Melynda Ripple, MD  tiZANidine (ZANAFLEX) 4 MG tablet Take 1 tablet (4 mg total) by mouth every 8 (eight) hours as needed for muscle spasms. 07/13/20   Melynda Ripple, MD     Physical Exam: He has minimal crusting within the posterior turbinate regions bilaterally that was removed in the office.  The middle meatus regions were clear bilaterally with no evidence of active infection.  Still has some mild inflammatory changes but this was minimal.   Assessment: S/p septoplasty, turbinate reductions and Fess.  Patient doing well.  Plan: Discussed with him concerning use of saline nasal irrigations and/or Flonase or Nasacort as needed allergies. He will follow-up as needed.   Radene Journey, MD

## 2020-10-11 ENCOUNTER — Other Ambulatory Visit: Payer: Self-pay

## 2020-10-11 ENCOUNTER — Encounter: Payer: Self-pay | Admitting: Internal Medicine

## 2020-10-11 ENCOUNTER — Ambulatory Visit: Payer: Managed Care, Other (non HMO) | Admitting: Internal Medicine

## 2020-10-11 VITALS — BP 144/94 | HR 94 | Temp 98.4°F | Resp 16 | Ht 69.5 in | Wt 217.6 lb

## 2020-10-11 DIAGNOSIS — Z1159 Encounter for screening for other viral diseases: Secondary | ICD-10-CM | POA: Insufficient documentation

## 2020-10-11 DIAGNOSIS — E039 Hypothyroidism, unspecified: Secondary | ICD-10-CM

## 2020-10-11 DIAGNOSIS — J301 Allergic rhinitis due to pollen: Secondary | ICD-10-CM | POA: Diagnosis not present

## 2020-10-11 DIAGNOSIS — R9431 Abnormal electrocardiogram [ECG] [EKG]: Secondary | ICD-10-CM | POA: Diagnosis not present

## 2020-10-11 DIAGNOSIS — I10 Essential (primary) hypertension: Secondary | ICD-10-CM | POA: Diagnosis not present

## 2020-10-11 DIAGNOSIS — Z0001 Encounter for general adult medical examination with abnormal findings: Secondary | ICD-10-CM | POA: Diagnosis not present

## 2020-10-11 LAB — LDL CHOLESTEROL, DIRECT: Direct LDL: 132 mg/dL

## 2020-10-11 LAB — URINALYSIS, ROUTINE W REFLEX MICROSCOPIC
Bilirubin Urine: NEGATIVE
Hgb urine dipstick: NEGATIVE
Ketones, ur: NEGATIVE
Leukocytes,Ua: NEGATIVE
Nitrite: NEGATIVE
RBC / HPF: NONE SEEN (ref 0–?)
Specific Gravity, Urine: 1.02 (ref 1.000–1.030)
Total Protein, Urine: NEGATIVE
Urine Glucose: NEGATIVE
Urobilinogen, UA: 0.2 (ref 0.0–1.0)
pH: 7 (ref 5.0–8.0)

## 2020-10-11 LAB — CBC WITH DIFFERENTIAL/PLATELET
Basophils Absolute: 0.1 10*3/uL (ref 0.0–0.1)
Basophils Relative: 0.8 % (ref 0.0–3.0)
Eosinophils Absolute: 0.1 10*3/uL (ref 0.0–0.7)
Eosinophils Relative: 1.3 % (ref 0.0–5.0)
HCT: 45.4 % (ref 39.0–52.0)
Hemoglobin: 15.9 g/dL (ref 13.0–17.0)
Lymphocytes Relative: 19.7 % (ref 12.0–46.0)
Lymphs Abs: 1.6 10*3/uL (ref 0.7–4.0)
MCHC: 35 g/dL (ref 30.0–36.0)
MCV: 89.5 fl (ref 78.0–100.0)
Monocytes Absolute: 0.6 10*3/uL (ref 0.1–1.0)
Monocytes Relative: 7.5 % (ref 3.0–12.0)
Neutro Abs: 5.8 10*3/uL (ref 1.4–7.7)
Neutrophils Relative %: 70.7 % (ref 43.0–77.0)
Platelets: 212 10*3/uL (ref 150.0–400.0)
RBC: 5.07 Mil/uL (ref 4.22–5.81)
RDW: 13 % (ref 11.5–15.5)
WBC: 8.2 10*3/uL (ref 4.0–10.5)

## 2020-10-11 LAB — LIPID PANEL
Cholesterol: 200 mg/dL (ref 0–200)
HDL: 43.9 mg/dL (ref >39.00)
NonHDL: 155.94
Total CHOL/HDL Ratio: 5
Triglycerides: 256 mg/dL — ABNORMAL HIGH (ref 0.0–149.0)
VLDL: 51.2 mg/dL — ABNORMAL HIGH (ref 0.0–40.0)

## 2020-10-11 LAB — VITAMIN D 25 HYDROXY (VIT D DEFICIENCY, FRACTURES): VITD: 19.06 ng/mL — ABNORMAL LOW (ref 30.00–100.00)

## 2020-10-11 LAB — BASIC METABOLIC PANEL
BUN: 18 mg/dL (ref 6–23)
CO2: 28 mEq/L (ref 19–32)
Calcium: 9.8 mg/dL (ref 8.4–10.5)
Chloride: 102 mEq/L (ref 96–112)
Creatinine, Ser: 1.25 mg/dL (ref 0.40–1.50)
GFR: 67.15 mL/min (ref >60.00)
Glucose, Bld: 99 mg/dL (ref 70–99)
Potassium: 4.4 mEq/L (ref 3.5–5.1)
Sodium: 139 mEq/L (ref 135–145)

## 2020-10-11 LAB — TSH: TSH: 8.76 u[IU]/mL — ABNORMAL HIGH (ref 0.35–4.50)

## 2020-10-11 LAB — HEPATIC FUNCTION PANEL
ALT: 29 U/L (ref 0–53)
AST: 24 U/L (ref 0–37)
Albumin: 4.9 g/dL (ref 3.5–5.2)
Alkaline Phosphatase: 92 U/L (ref 39–117)
Bilirubin, Direct: 0.1 mg/dL (ref 0.0–0.3)
Total Bilirubin: 0.7 mg/dL (ref 0.2–1.2)
Total Protein: 7.3 g/dL (ref 6.0–8.3)

## 2020-10-11 LAB — PSA: PSA: 0.71 ng/mL (ref 0.10–4.00)

## 2020-10-11 MED ORDER — AZELASTINE-FLUTICASONE 137-50 MCG/ACT NA SUSP: 1.0000 | Freq: Two times a day (BID) | NASAL | 1 refills | Status: AC

## 2020-10-11 MED ORDER — LEVOCETIRIZINE DIHYDROCHLORIDE 5 MG PO TABS: 5.0000 mg | ORAL_TABLET | Freq: Every evening | ORAL | 1 refills | Status: DC

## 2020-10-11 MED ORDER — LEVOTHYROXINE SODIUM 25 MCG PO TABS: 25.0000 ug | ORAL_TABLET | Freq: Every day | ORAL | 0 refills | Status: DC

## 2020-10-11 NOTE — Patient Instructions (Signed)

## 2020-10-11 NOTE — Progress Notes (Signed)
allergi  Subjective:  Patient ID: Seth Gonzales, male    DOB: 03/02/1971  Age: 50 y.o. MRN: [id number]  CC: New Patient (Initial Visit), Annual Exam, and Hypertension  This visit occurred during the SARS-CoV-2 public health emergency.  Safety protocols were in place, including screening questions prior to the visit, additional usage of staff PPE, and extensive cleaning of exam room while observing appropriate contact time as indicated for disinfecting solutions.    HPI Seth Gonzales presents for a CPX, f/up, and to establish.  He is status post rhinoplasty.  He complains of chronic postnasal drip.  He denies epistaxis, congestion, or facial pain.  He has rare sneezing.  He is very active and denies any recent episodes of chest pain, shortness of breath, palpitations, diaphoresis, edema, or fatigue.  History Seth Gonzales has a past medical history of Chronic pansinusitis, COVID-19 (02/2020), GERD (gastroesophageal reflux disease), adenomatous colonic polyps (09/01/2020), and Nasal septal deviation.   He has a past surgical history that includes Shoulder surgery (Right); Nasal sinus surgery (08/12/2020); and Colonoscopy w/ polypectomy (08/2020).   His family history includes Alcoholism in his father; Cirrhosis in an other family member; Colon polyps in his father; Diabetes in his father; Pancreatic cancer in his mother.He reports that he has never smoked. He has quit using smokeless tobacco.  His smokeless tobacco use included snuff. He reports current alcohol use. He reports that he does not use drugs.  Outpatient Medications Prior to Visit  Medication Sig Dispense Refill   ibuprofen (ADVIL) 600 MG tablet Take 1 tablet (600 mg total) by mouth every 6 (six) hours as needed. 30 tablet 0   cephALEXin (KEFLEX) 500 MG capsule Take 1 capsule (500 mg total) by mouth 2 (two) times daily. 20 capsule 0   HYDROcodone-acetaminophen (NORCO/VICODIN) 5-325 MG tablet Take 1-2 tablets by mouth every 6  (six) hours as needed for moderate pain. (Patient not taking: Reported on 10/11/2020) 10 tablet 0   tiZANidine (ZANAFLEX) 4 MG tablet Take 1 tablet (4 mg total) by mouth every 8 (eight) hours as needed for muscle spasms. (Patient not taking: Reported on 10/11/2020) 30 tablet 0   No facility-administered medications prior to visit.    ROS Review of Systems  Constitutional:  Negative for diaphoresis and fatigue.  HENT:  Positive for postnasal drip and rhinorrhea. Negative for facial swelling, nosebleeds and sinus pressure.   Respiratory:  Negative for cough, chest tightness, shortness of breath and wheezing.   Cardiovascular:  Negative for chest pain, palpitations and leg swelling.  Gastrointestinal:  Negative for abdominal pain, diarrhea and nausea.  Endocrine: Negative.   Genitourinary: Negative.  Negative for difficulty urinating, scrotal swelling and testicular pain.  Musculoskeletal: Negative.   Skin: Negative.  Negative for color change and pallor.  Neurological: Negative.  Negative for dizziness and weakness.  Hematological:  Negative for adenopathy. Does not bruise/bleed easily.  Psychiatric/Behavioral: Negative.     Objective:  BP (!) 144/94 (BP Location: Left Arm, Patient Position: Sitting, Cuff Size: Large)   Pulse 94   Temp 98.4 F (36.9 C) (Oral)   Resp 16   Ht 5' 9.5" (1.765 m)   Wt 217 lb 9.6 oz (98.7 kg)   SpO2 96%   BMI 31.67 kg/m   Physical Exam Vitals reviewed.  Constitutional:      Appearance: Normal appearance.  HENT:     Nose: Mucosal edema and rhinorrhea present. No congestion.     Left Nostril: No epistaxis.  Right Sinus: No maxillary sinus tenderness or frontal sinus tenderness.     Left Sinus: No maxillary sinus tenderness or frontal sinus tenderness.     Mouth/Throat:     Mouth: Mucous membranes are moist.  Eyes:     General: No scleral icterus.    Conjunctiva/sclera: Conjunctivae normal.  Cardiovascular:     Rate and Rhythm: Normal rate and  regular rhythm.     Pulses: Normal pulses.     Heart sounds: No murmur heard.   No gallop.     Comments: EKG- NSR, 71 bpm ? Q waves in III and aVF - new compared to EKG description from 2 years ago No LVH Pulmonary:     Effort: Pulmonary effort is normal.     Breath sounds: No stridor. No wheezing, rhonchi or rales.  Abdominal:     General: Abdomen is flat.     Palpations: There is no mass.     Tenderness: There is no abdominal tenderness. There is no guarding.     Hernia: No hernia is present.  Musculoskeletal:        General: Normal range of motion.     Cervical back: Neck supple.     Right lower leg: No edema.     Left lower leg: No edema.  Skin:    General: Skin is warm and dry.  Neurological:     General: No focal deficit present.     Mental Status: He is alert.  Psychiatric:        Mood and Affect: Mood normal.        Behavior: Behavior normal.    Lab Results  Component Value Date   WBC 8.2 10/11/2020   HGB 15.9 10/11/2020   HCT 45.4 10/11/2020   PLT 212.0 10/11/2020   GLUCOSE 99 10/11/2020   CHOL 200 10/11/2020   TRIG 256.0 (H) 10/11/2020   HDL 43.90 10/11/2020   LDLDIRECT 132.0 10/11/2020   ALT 29 10/11/2020   AST 24 10/11/2020   NA 139 10/11/2020   K 4.4 10/11/2020   CL 102 10/11/2020   CREATININE 1.25 10/11/2020   BUN 18 10/11/2020   CO2 28 10/11/2020   TSH 8.76 (H) 10/11/2020   PSA 0.71 10/11/2020     Assessment & Plan:   Seth Gonzales was seen today for new patient (initial visit), annual exam and hypertension.  Diagnoses and all orders for this visit:  Seasonal allergic rhinitis due to pollen -     Azelastine-Fluticasone 137-50 MCG/ACT SUSP; Place 1 spray into the nose every 12 (twelve) hours. -     levocetirizine (XYZAL) 5 MG tablet; Take 1 tablet (5 mg total) by mouth every evening.  Encounter for general adult medical examination with abnormal findings- Exam completed, labs reviewed-statin therapy is not indicated, vaccines reviewed and  updated, cancer screenings are up-to-date, patient education was given. -     Lipid panel; Future -     PSA; Future -     Hepatitis C antibody; Future -     HIV Antibody (routine testing w rflx); Future -     HIV Antibody (routine testing w rflx) -     Hepatitis C antibody -     PSA -     Lipid panel  Primary hypertension- He has stage I hypertension.  His EKG is concerning for infarct.  I will check labs to screen for secondary causes and endorgan damage.  We will treat this with an ARB. -     EKG  12-Lead -     CBC with Differential/Platelet; Future -     Basic metabolic panel; Future -     Aldosterone + renin activity w/ ratio; Future -     TSH; Future -     Urinalysis, Routine w reflex microscopic; Future -     Hepatic function panel; Future -     VITAMIN D 25 Hydroxy (Vit-D Deficiency, Fractures); Future -     VITAMIN D 25 Hydroxy (Vit-D Deficiency, Fractures) -     Hepatic function panel -     Urinalysis, Routine w reflex microscopic -     TSH -     Aldosterone + renin activity w/ ratio -     Basic metabolic panel -     CBC with Differential/Platelet -     olmesartan (BENICAR) 20 MG tablet; Take 1 tablet (20 mg total) by mouth daily.  Abnormal electrocardiogram (ECG) (EKG)- Will screen for ischemia/scar. -     Cardiac Stress Test: Informed Consent Details: Physician/Practitioner Attestation; Transcribe to consent form and obtain patient signature; Future  Need for hepatitis C screening test -     Hepatitis C antibody; Future -     Hepatitis C antibody  Abnormal electrocardiogram -     MYOCARDIAL PERFUSION IMAGING; Future -     Cardiac Stress Test: Informed Consent Details: Physician/Practitioner Attestation; Transcribe to consent form and obtain patient signature; Future  Acquired hypothyroidism- I recommended that he start taking T4. -     levothyroxine (SYNTHROID) 25 MCG tablet; Take 1 tablet (25 mcg total) by mouth daily.  Other orders -     LDL cholesterol,  direct  I have discontinued Morrison Old "Chuck"'s ibuprofen, tiZANidine, cephALEXin, and HYDROcodone-acetaminophen. I am also having him start on Azelastine-Fluticasone, levocetirizine, levothyroxine, and olmesartan.  Meds ordered this encounter  Medications   Azelastine-Fluticasone 137-50 MCG/ACT SUSP    Sig: Place 1 spray into the nose every 12 (twelve) hours.    Dispense:  69 g    Refill:  1   levocetirizine (XYZAL) 5 MG tablet    Sig: Take 1 tablet (5 mg total) by mouth every evening.    Dispense:  90 tablet    Refill:  1   levothyroxine (SYNTHROID) 25 MCG tablet    Sig: Take 1 tablet (25 mcg total) by mouth daily.    Dispense:  90 tablet    Refill:  0   olmesartan (BENICAR) 20 MG tablet    Sig: Take 1 tablet (20 mg total) by mouth daily.    Dispense:  90 tablet    Refill:  0     Follow-up: Return in about 3 months (around 01/11/2021).  Scarlette Calico, MD

## 2020-10-13 ENCOUNTER — Encounter: Payer: Self-pay | Admitting: Internal Medicine

## 2020-10-13 ENCOUNTER — Other Ambulatory Visit: Payer: Self-pay | Admitting: Internal Medicine

## 2020-10-13 DIAGNOSIS — E559 Vitamin D deficiency, unspecified: Secondary | ICD-10-CM

## 2020-10-13 MED ORDER — OLMESARTAN MEDOXOMIL 20 MG PO TABS: 20.0000 mg | ORAL_TABLET | Freq: Every day | ORAL | 0 refills | Status: DC

## 2020-10-13 MED ORDER — CHOLECALCIFEROL 1.25 MG (50000 UT) PO CAPS: 50000.0000 [IU] | ORAL_CAPSULE | ORAL | 0 refills | Status: DC

## 2020-10-14 ENCOUNTER — Other Ambulatory Visit: Payer: Self-pay | Admitting: Internal Medicine

## 2020-10-14 DIAGNOSIS — R9431 Abnormal electrocardiogram [ECG] [EKG]: Secondary | ICD-10-CM

## 2020-10-14 NOTE — Telephone Encounter (Signed)
Pt scheduled for 10/28/20  @ 730am

## 2020-10-16 LAB — HEPATITIS C ANTIBODY
Hepatitis C Ab: NONREACTIVE
SIGNAL TO CUT-OFF: 0.08 (ref <1.00)

## 2020-10-16 LAB — HIV ANTIBODY (ROUTINE TESTING W REFLEX): HIV 1&2 Ab, 4th Generation: NONREACTIVE

## 2020-10-16 LAB — ALDOSTERONE + RENIN ACTIVITY W/ RATIO
Aldosterone: <1 ng/dL
Renin Activity: 0.3 ng/mL/h (ref 0.25–5.82)

## 2020-10-26 ENCOUNTER — Telehealth: Payer: Self-pay

## 2020-10-26 NOTE — Telephone Encounter (Signed)
Spoke with the patient, detailed instructions given. He stated that he would be here for his test. Asked to call back with any questions. S.Damita Eppard EMTP 

## 2020-10-28 ENCOUNTER — Ambulatory Visit (HOSPITAL_COMMUNITY): Payer: Managed Care, Other (non HMO) | Attending: Cardiology

## 2020-10-28 ENCOUNTER — Other Ambulatory Visit: Payer: Self-pay

## 2020-10-28 DIAGNOSIS — R9431 Abnormal electrocardiogram [ECG] [EKG]: Secondary | ICD-10-CM | POA: Diagnosis not present

## 2020-10-28 LAB — MYOCARDIAL PERFUSION IMAGING
Estimated workload: 7 METS
Exercise duration (min): 7 min
Exercise duration (sec): 1 s
LV dias vol: 77 mL (ref 62–150)
LV sys vol: 29 mL
MPHR: 170 {beats}/min
Peak HR: 157 {beats}/min
Percent HR: 92 %
Rest HR: 68 {beats}/min
SDS: 0
SRS: 0
SSS: 0
TID: 0.79

## 2020-10-28 MED ORDER — TECHNETIUM TC 99M TETROFOSMIN IV KIT
29.4000 | PACK | Freq: Once | INTRAVENOUS | Status: AC | PRN
Start: 2020-10-28 — End: 2020-10-28
  Administered 2020-10-28: 29.4 via INTRAVENOUS
  Filled 2020-10-28: qty 30

## 2020-10-28 MED ORDER — TECHNETIUM TC 99M TETROFOSMIN IV KIT
10.1000 | PACK | Freq: Once | INTRAVENOUS | Status: AC | PRN
  Administered 2020-10-28: 10.1 via INTRAVENOUS
  Filled 2020-10-28: qty 11

## 2021-01-04 ENCOUNTER — Other Ambulatory Visit: Payer: Self-pay | Admitting: Internal Medicine

## 2021-01-04 DIAGNOSIS — E559 Vitamin D deficiency, unspecified: Secondary | ICD-10-CM

## 2021-01-07 ENCOUNTER — Other Ambulatory Visit: Payer: Self-pay | Admitting: Internal Medicine

## 2021-01-07 DIAGNOSIS — E039 Hypothyroidism, unspecified: Secondary | ICD-10-CM

## 2021-01-10 ENCOUNTER — Other Ambulatory Visit: Payer: Self-pay | Admitting: Internal Medicine

## 2021-01-10 DIAGNOSIS — I10 Essential (primary) hypertension: Secondary | ICD-10-CM

## 2021-01-11 ENCOUNTER — Ambulatory Visit: Payer: Managed Care, Other (non HMO) | Admitting: Internal Medicine

## 2021-01-11 ENCOUNTER — Encounter: Payer: Self-pay | Admitting: Internal Medicine

## 2021-01-11 ENCOUNTER — Other Ambulatory Visit: Payer: Self-pay

## 2021-01-11 VITALS — BP 128/84 | HR 78 | Temp 98.0°F | Resp 16 | Ht 69.5 in | Wt 217.0 lb

## 2021-01-11 DIAGNOSIS — E039 Hypothyroidism, unspecified: Secondary | ICD-10-CM

## 2021-01-11 DIAGNOSIS — I10 Essential (primary) hypertension: Secondary | ICD-10-CM

## 2021-01-11 LAB — BASIC METABOLIC PANEL
BUN: 23 mg/dL (ref 6–23)
CO2: 28 mEq/L (ref 19–32)
Calcium: 9.5 mg/dL (ref 8.4–10.5)
Chloride: 102 mEq/L (ref 96–112)
Creatinine, Ser: 1.23 mg/dL (ref 0.40–1.50)
GFR: 68.34 mL/min (ref >60.00)
Glucose, Bld: 96 mg/dL (ref 70–99)
Potassium: 4.6 mEq/L (ref 3.5–5.1)
Sodium: 137 mEq/L (ref 135–145)

## 2021-01-11 LAB — TSH: TSH: 4.69 u[IU]/mL (ref 0.35–5.50)

## 2021-01-11 MED ORDER — OLMESARTAN MEDOXOMIL 20 MG PO TABS: 20.0000 mg | ORAL_TABLET | Freq: Every day | ORAL | 1 refills | Status: DC

## 2021-01-11 NOTE — Patient Instructions (Signed)

## 2021-01-11 NOTE — Progress Notes (Signed)
Subjective:  Patient ID: Seth Gonzales, male    DOB: [birth year]  Age: 50 y.o. MRN: [id number]  CC: Hypothyroidism and Hypertension  This visit occurred during the SARS-CoV-2 public health emergency.  Safety protocols were in place, including screening questions prior to the visit, additional usage of staff PPE, and extensive cleaning of exam room while observing appropriate contact time as indicated for disinfecting solutions.    HPI Seth Gonzales presents for f/up -  He tells me his energy level has improved since he started taking T4.  His blood pressure is well controlled and he has had no recent episodes of chest pain, shortness of breath, diaphoresis, or edema.  Outpatient Medications Prior to Visit  Medication Sig Dispense Refill   Azelastine-Fluticasone 137-50 MCG/ACT SUSP Place 1 spray into the nose every 12 (twelve) hours. 69 g 1   Cholecalciferol (VITAMIN D3) 1.25 MG (50000 UT) CAPS TAKE 1 CAPSULE BY MOUTH ONE TIME PER WEEK 12 capsule 0   levothyroxine (SYNTHROID) 25 MCG tablet TAKE 1 TABLET BY MOUTH EVERY DAY 90 tablet 0   olmesartan (BENICAR) 20 MG tablet Take 1 tablet (20 mg total) by mouth daily. 90 tablet 0   levocetirizine (XYZAL) 5 MG tablet Take 1 tablet (5 mg total) by mouth every evening. 90 tablet 1   No facility-administered medications prior to visit.    ROS Review of Systems  Constitutional:  Negative for chills, fatigue and fever.  HENT: Negative.    Eyes: Negative.   Respiratory:  Negative for cough, shortness of breath and wheezing.   Cardiovascular:  Negative for chest pain, palpitations and leg swelling.  Gastrointestinal:  Negative for abdominal pain, diarrhea and vomiting.  Endocrine: Negative for cold intolerance and heat intolerance.  Genitourinary: Negative.   Musculoskeletal: Negative.  Negative for arthralgias and myalgias.  Skin: Negative.  Negative for color change.  Neurological: Negative.  Negative for dizziness and weakness.   Hematological:  Negative for adenopathy. Does not bruise/bleed easily.  Psychiatric/Behavioral: Negative.     Objective:  BP 128/84 (BP Location: Left Arm, Patient Position: Sitting, Cuff Size: Large)   Pulse 78   Temp 98 F (36.7 C) (Oral)   Resp 16   Ht 5' 9.5" (1.765 m)   Wt 217 lb (98.4 kg)   SpO2 99%   BMI 31.59 kg/m   BP Readings from Last 3 Encounters:  01/11/21 128/84  10/11/20 (!) 144/94  08/23/20 121/76    Wt Readings from Last 3 Encounters:  01/11/21 217 lb (98.4 kg)  10/11/20 217 lb 9.6 oz (98.7 kg)  08/23/20 217 lb (98.4 kg)    Physical Exam Vitals reviewed.  Constitutional:      Appearance: Normal appearance.  HENT:     Nose: Nose normal.     Mouth/Throat:     Mouth: Mucous membranes are moist.  Eyes:     Conjunctiva/sclera: Conjunctivae normal.  Cardiovascular:     Rate and Rhythm: Normal rate and regular rhythm.     Heart sounds: No murmur heard. Pulmonary:     Effort: Pulmonary effort is normal.     Breath sounds: No stridor. No wheezing, rhonchi or rales.  Abdominal:     General: Abdomen is flat.     Palpations: There is no mass.     Tenderness: There is no abdominal tenderness. There is no guarding.  Musculoskeletal:        General: Normal range of motion.     Cervical back: Neck supple.  Right lower leg: No edema.     Left lower leg: No edema.  Lymphadenopathy:     Cervical: No cervical adenopathy.  Skin:    General: Skin is warm and dry.  Neurological:     General: No focal deficit present.     Mental Status: He is alert.  Psychiatric:        Mood and Affect: Mood normal.        Behavior: Behavior normal.    Lab Results  Component Value Date   WBC 8.2 10/11/2020   HGB 15.9 10/11/2020   HCT 45.4 10/11/2020   PLT 212.0 10/11/2020   GLUCOSE 96 01/11/2021   CHOL 200 10/11/2020   TRIG 256.0 (H) 10/11/2020   HDL 43.90 10/11/2020   LDLDIRECT 132.0 10/11/2020   ALT 29 10/11/2020   AST 24 10/11/2020   NA 137 01/11/2021   K  4.6 01/11/2021   CL 102 01/11/2021   CREATININE 1.23 01/11/2021   BUN 23 01/11/2021   CO2 28 01/11/2021   TSH 4.69 01/11/2021   PSA 0.71 10/11/2020    DG Ribs Unilateral W/Chest Left  Result Date: 07/13/2020 CLINICAL DATA:  Fifth rib tenderness EXAM: LEFT RIBS AND CHEST - 3+ VIEW COMPARISON:  03/21/2020 FINDINGS: No fracture or other bone lesions are seen involving the ribs. There is no evidence of pneumothorax or pleural effusion. Left basilar atelectasis/scarring. Heart size and mediastinal contours are within normal limits. IMPRESSION: No significant abnormality of the left ribs. Electronically Signed   By: Macy Mis M.D.   On: 07/13/2020 21:03    Assessment & Plan:   Seth Gonzales was seen today for hypothyroidism and hypertension.  Diagnoses and all orders for this visit:  Acquired hypothyroidism- His TSH is in the normal range.  He will stay on the current dose of levothyroxine. -     TSH; Future -     TSH  Primary hypertension- His blood pressure is well controlled.  Electrolytes and renal function are normal.  Will continue the current dose of the ARB. -     Basic metabolic panel; Future -     olmesartan (BENICAR) 20 MG tablet; Take 1 tablet (20 mg total) by mouth daily. -     Basic metabolic panel  I have discontinued Seth Old "Chuck"'s levocetirizine. I am also having him maintain his Azelastine-Fluticasone, Vitamin D3, levothyroxine, and olmesartan.  Meds ordered this encounter  Medications   olmesartan (BENICAR) 20 MG tablet    Sig: Take 1 tablet (20 mg total) by mouth daily.    Dispense:  90 tablet    Refill:  1      Follow-up: Return in about 6 months (around 07/11/2021).  Scarlette Calico, MD

## 2021-07-04 ENCOUNTER — Other Ambulatory Visit: Payer: Self-pay | Admitting: Internal Medicine

## 2021-07-04 DIAGNOSIS — E039 Hypothyroidism, unspecified: Secondary | ICD-10-CM

## 2021-07-05 MED ORDER — LEVOTHYROXINE SODIUM 25 MCG PO TABS: 25.0000 ug | ORAL_TABLET | Freq: Every day | ORAL | 0 refills | Status: DC

## 2021-10-31 ENCOUNTER — Encounter: Payer: Self-pay | Admitting: Internal Medicine

## 2021-12-14 ENCOUNTER — Encounter: Payer: Self-pay | Admitting: Internal Medicine

## 2021-12-14 ENCOUNTER — Other Ambulatory Visit: Payer: Self-pay | Admitting: Internal Medicine

## 2021-12-14 IMAGING — DX DG RIBS W/ CHEST 3+V*L*
3 series · 3 of 3 positions shown · non-contrast
Comparison: 03/21/2020

CLINICAL DATA: Fifth rib tenderness

EXAM:
LEFT RIBS AND CHEST - 3+ VIEW

[chest pa]
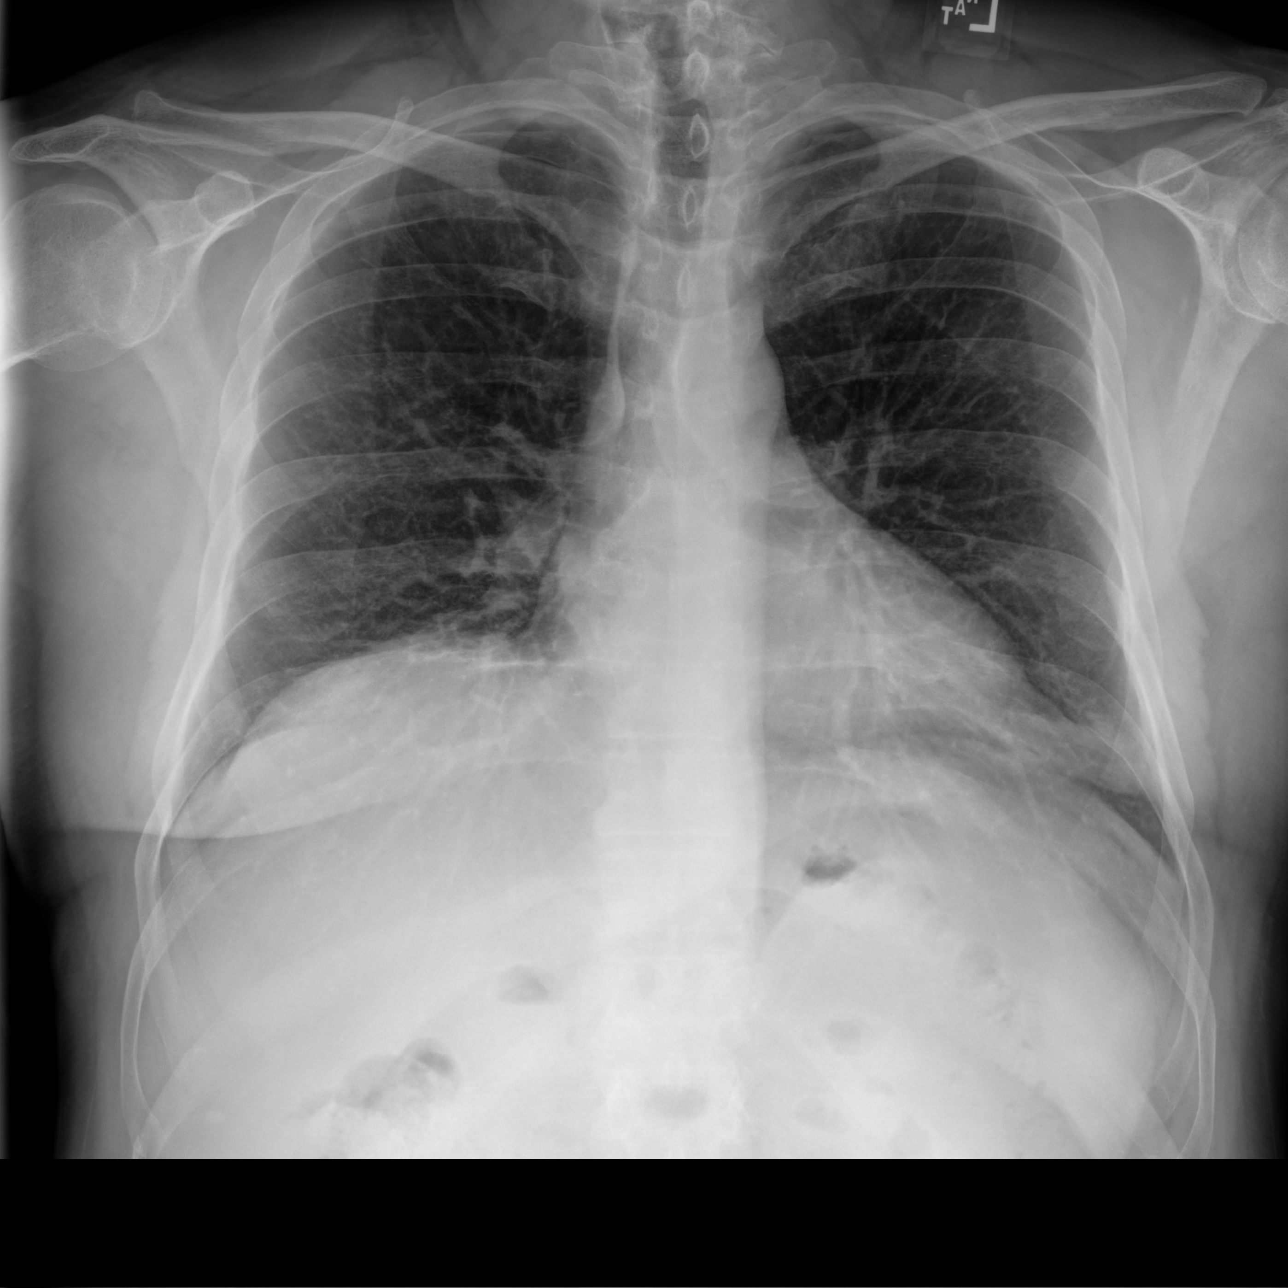

[hemithorax (ribs) pa (1 of 2)]
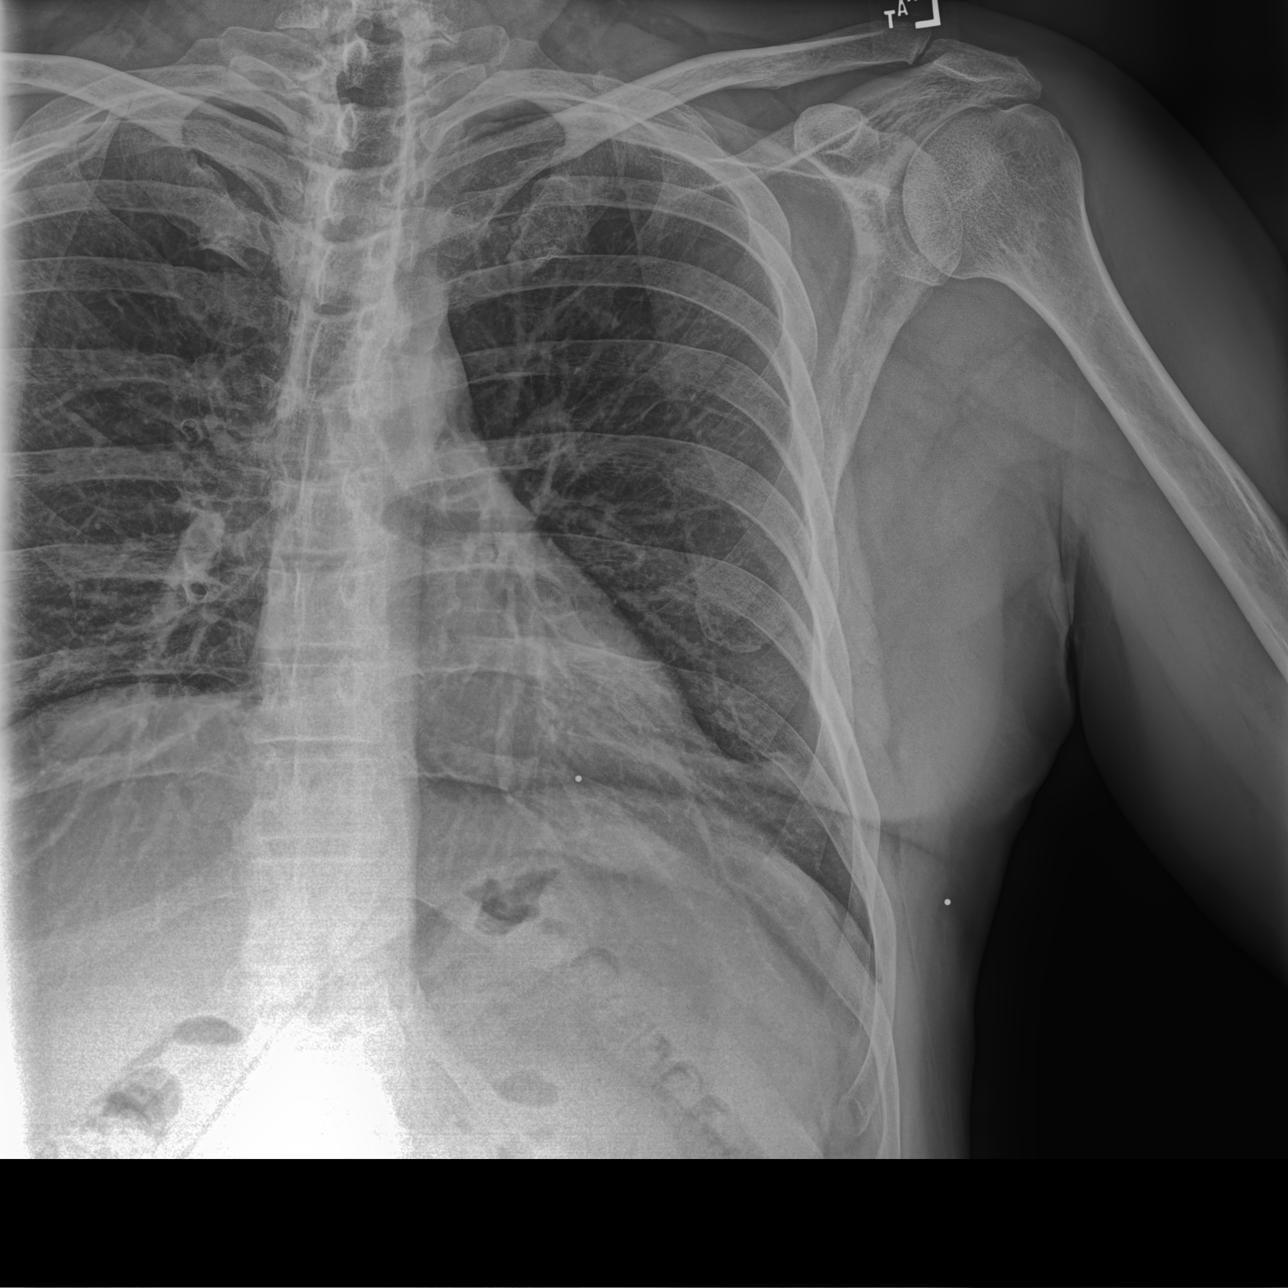

[hemithorax (ribs) pa (2 of 2)]
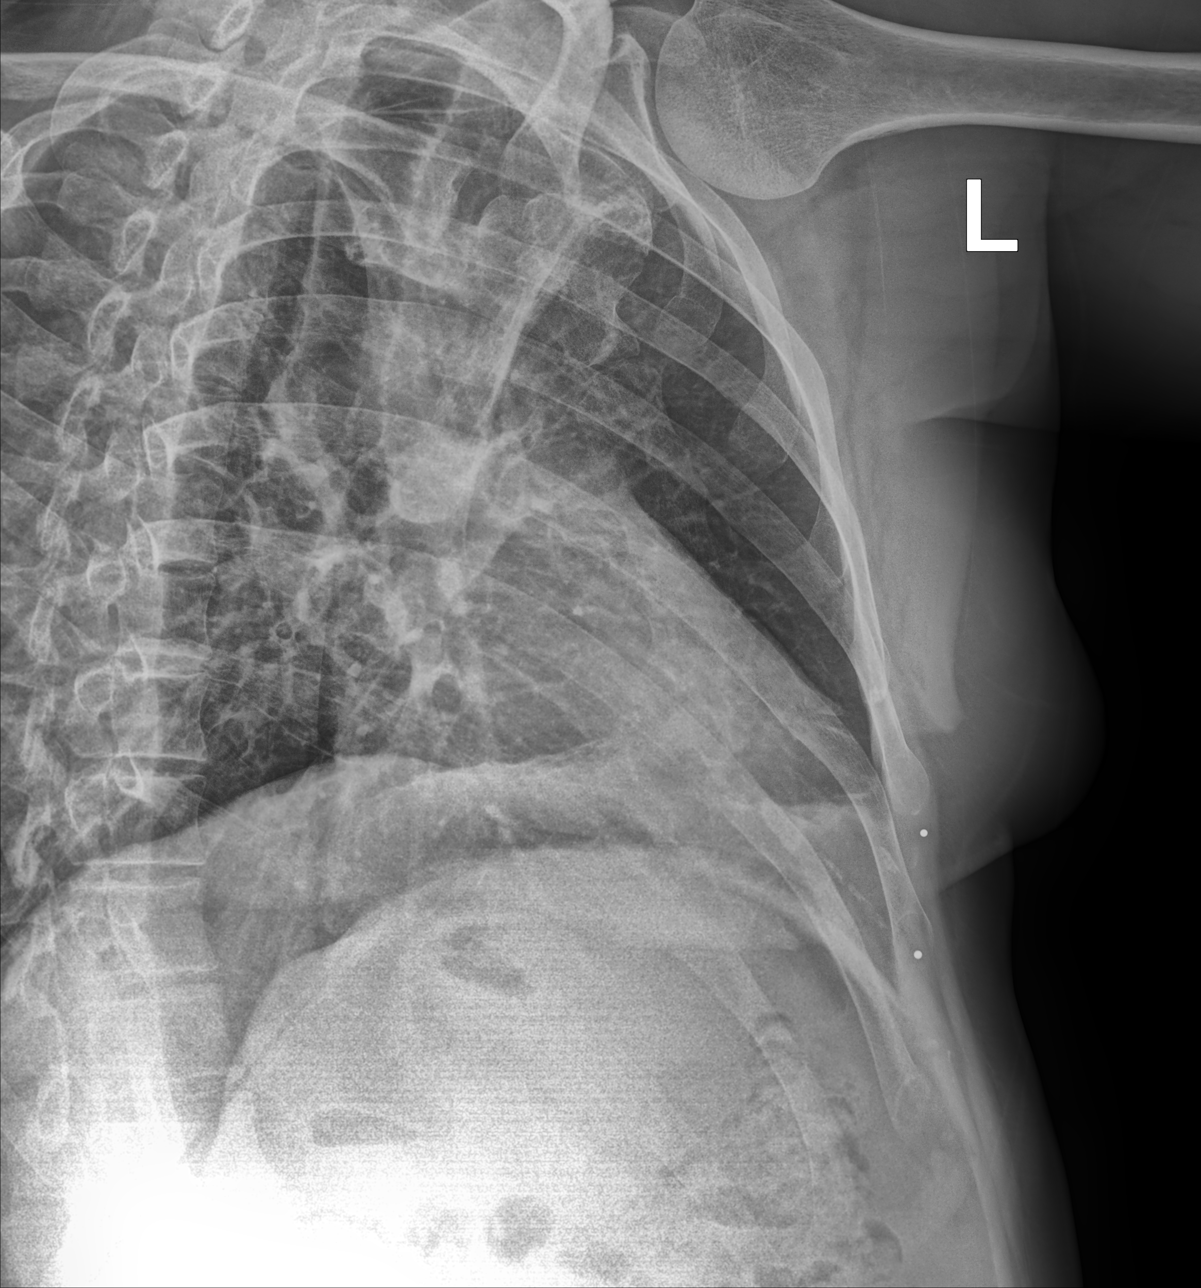

[3 of 3 positions shown; findings below may reference images not displayed]

FINDINGS: No fracture or other bone lesions are seen involving the ribs. There
is no evidence of pneumothorax or pleural effusion. Left basilar
atelectasis/scarring. Heart size and mediastinal contours are
within normal limits.
IMPRESSION: No significant abnormality of the left ribs.

## 2021-12-15 ENCOUNTER — Other Ambulatory Visit: Payer: Self-pay | Admitting: Internal Medicine

## 2021-12-15 DIAGNOSIS — I10 Essential (primary) hypertension: Secondary | ICD-10-CM

## 2021-12-15 DIAGNOSIS — E039 Hypothyroidism, unspecified: Secondary | ICD-10-CM

## 2021-12-15 MED ORDER — OLMESARTAN MEDOXOMIL 20 MG PO TABS: 20.0000 mg | ORAL_TABLET | Freq: Every day | ORAL | 0 refills | Status: DC

## 2021-12-15 MED ORDER — LEVOTHYROXINE SODIUM 25 MCG PO TABS: 25.0000 ug | ORAL_TABLET | Freq: Every day | ORAL | 0 refills | Status: DC

## 2021-12-29 ENCOUNTER — Ambulatory Visit: Payer: Managed Care, Other (non HMO) | Admitting: Internal Medicine

## 2022-01-15 ENCOUNTER — Other Ambulatory Visit: Payer: Self-pay | Admitting: Internal Medicine

## 2022-01-15 DIAGNOSIS — E039 Hypothyroidism, unspecified: Secondary | ICD-10-CM

## 2022-01-15 DIAGNOSIS — I10 Essential (primary) hypertension: Secondary | ICD-10-CM

## 2022-11-18 ENCOUNTER — Other Ambulatory Visit: Payer: Self-pay | Admitting: Internal Medicine

## 2022-11-18 DIAGNOSIS — E039 Hypothyroidism, unspecified: Secondary | ICD-10-CM

## 2023-07-04 ENCOUNTER — Ambulatory Visit: Payer: Self-pay

## 2023-11-27 ENCOUNTER — Other Ambulatory Visit: Payer: Self-pay

## 2023-11-27 ENCOUNTER — Encounter (HOSPITAL_COMMUNITY): Payer: Self-pay | Admitting: *Deleted

## 2023-11-27 ENCOUNTER — Emergency Department (HOSPITAL_COMMUNITY)
Admission: EM | Admit: 2023-11-27 | Discharge: 2023-11-27 | Disposition: A | Discharge status: Home / Self Care | Source: Ambulatory Visit | Attending: Emergency Medicine | Admitting: Emergency Medicine

## 2023-11-27 ENCOUNTER — Emergency Department (HOSPITAL_COMMUNITY)

## 2023-11-27 DIAGNOSIS — K5900 Constipation, unspecified: Secondary | ICD-10-CM | POA: Insufficient documentation

## 2023-11-27 DIAGNOSIS — I10 Essential (primary) hypertension: Secondary | ICD-10-CM | POA: Insufficient documentation

## 2023-11-27 DIAGNOSIS — E039 Hypothyroidism, unspecified: Secondary | ICD-10-CM | POA: Insufficient documentation

## 2023-11-27 DIAGNOSIS — R109 Unspecified abdominal pain: Secondary | ICD-10-CM | POA: Diagnosis present

## 2023-11-27 LAB — URINALYSIS, ROUTINE W REFLEX MICROSCOPIC
Bilirubin Urine: NEGATIVE
Glucose, UA: NEGATIVE mg/dL
Hgb urine dipstick: NEGATIVE
Ketones, ur: NEGATIVE mg/dL
Leukocytes,Ua: NEGATIVE
Nitrite: NEGATIVE
Protein, ur: NEGATIVE mg/dL
Specific Gravity, Urine: 1.021 (ref 1.005–1.030)
pH: 5 (ref 5.0–8.0)

## 2023-11-27 LAB — COMPREHENSIVE METABOLIC PANEL WITH GFR
ALT: 35 U/L (ref 0–44)
AST: 27 U/L (ref 15–41)
Albumin: 4.3 g/dL (ref 3.5–5.0)
Alkaline Phosphatase: 90 U/L (ref 38–126)
Anion gap: 11 (ref 5–15)
BUN: 15 mg/dL (ref 6–20)
CO2: 23 mmol/L (ref 22–32)
Calcium: 9 mg/dL (ref 8.9–10.3)
Chloride: 100 mmol/L (ref 98–111)
Creatinine, Ser: 1.19 mg/dL (ref 0.61–1.24)
GFR, Estimated: >60 mL/min (ref >60)
Glucose, Bld: 124 mg/dL — ABNORMAL HIGH (ref 70–99)
Potassium: 4.3 mmol/L (ref 3.5–5.1)
Sodium: 134 mmol/L — ABNORMAL LOW (ref 135–145)
Total Bilirubin: 0.6 mg/dL (ref 0.0–1.2)
Total Protein: 7.3 g/dL (ref 6.5–8.1)

## 2023-11-27 LAB — CBC WITH DIFFERENTIAL/PLATELET
Abs Immature Granulocytes: 0.08 K/uL — ABNORMAL HIGH (ref 0.00–0.07)
Basophils Absolute: 0.1 K/uL (ref 0.0–0.1)
Basophils Relative: 1 %
Eosinophils Absolute: 0.4 K/uL (ref 0.0–0.5)
Eosinophils Relative: 4 %
HCT: 46.2 % (ref 39.0–52.0)
Hemoglobin: 15.7 g/dL (ref 13.0–17.0)
Immature Granulocytes: 1 %
Lymphocytes Relative: 25 %
Lymphs Abs: 2.4 K/uL (ref 0.7–4.0)
MCH: 30.7 pg (ref 26.0–34.0)
MCHC: 34 g/dL (ref 30.0–36.0)
MCV: 90.4 fL (ref 80.0–100.0)
Monocytes Absolute: 0.7 K/uL (ref 0.1–1.0)
Monocytes Relative: 7 %
Neutro Abs: 6 K/uL (ref 1.7–7.7)
Neutrophils Relative %: 62 %
Platelets: 217 K/uL (ref 150–400)
RBC: 5.11 MIL/uL (ref 4.22–5.81)
RDW: 12.2 % (ref 11.5–15.5)
WBC: 9.6 K/uL (ref 4.0–10.5)
nRBC: 0 % (ref 0.0–0.2)

## 2023-11-27 LAB — LIPASE, BLOOD: Lipase: 46 U/L (ref 11–51)

## 2023-11-27 MED ORDER — OLMESARTAN MEDOXOMIL 20 MG PO TABS
20.0000 mg | ORAL_TABLET | Freq: Every day | ORAL | 0 refills | Status: AC
Start: 2023-11-27 — End: ?

## 2023-11-27 MED ORDER — POLYETHYLENE GLYCOL 3350 17 GM/SCOOP PO POWD: 17.0000 g | Freq: Every day | ORAL | 0 refills | Status: AC

## 2023-11-27 MED ORDER — LEVOTHYROXINE SODIUM 25 MCG PO TABS: 25.0000 ug | ORAL_TABLET | Freq: Every day | ORAL | 0 refills | Status: AC

## 2023-11-27 MED ORDER — IOHEXOL 300 MG/ML  SOLN
100.0000 mL | Freq: Once | INTRAMUSCULAR | Status: AC | PRN
  Administered 2023-11-27: 100 mL via INTRAVENOUS

## 2023-11-27 MED ORDER — POLYETHYLENE GLYCOL 3350 17 GM/SCOOP PO POWD: 17.0000 g | Freq: Every day | ORAL | 0 refills | Status: DC

## 2023-11-27 MED ORDER — OLMESARTAN MEDOXOMIL 20 MG PO TABS: 20.0000 mg | ORAL_TABLET | Freq: Every day | ORAL | 0 refills | Status: DC

## 2023-11-27 MED ORDER — LEVOTHYROXINE SODIUM 25 MCG PO TABS: 25.0000 ug | ORAL_TABLET | Freq: Every day | ORAL | 0 refills | Status: DC

## 2023-11-27 NOTE — Discharge Instructions (Signed)
 You have constipation. Your workup was reassuring otherwise. Please try using miralax  daily to prevent constipation.

## 2023-11-27 NOTE — ED Triage Notes (Signed)
 BIB spouse from [facility] for abd pain. Onset 0500 this am. Endorses pain, sweats, fever, and chills. Denies other sx. Alert, NAD, calm, interactive, resps e/u, steady gait. Wants imaging. Concerned about appendix.

## 2023-11-27 NOTE — ED Provider Notes (Signed)
 [place] EMERGENCY DEPARTMENT AT [facility] Provider Note   CSN: [id number] Arrival date & time: 11/27/23  9249   Seth Gonzales is a 53 y.o. male presenting for abdominal pain.   -Abdominal pain starting at 0500 this morning  -Below belly button, nonradiating, intermittent  -Some nausea, no vomiting -Sweating with initial onset  -General fatigue -No recent fever, cough, congestion  -Last BM Sunday, generally goes 2-3 days between Bms -No diarrhea, no blood in stools -Has been out of BP meds for a couple months now due to insurance change -No HA, VC, CP, SOB -No dysuria, hematuria   PMH Colonic polyps GERD HTN  Hypothyroidism   Surg hx Colonoscopy w polypectomy 2022  The history is provided by the patient and the spouse.      Prior to Admission medications   Medication Sig Start Date End Date Taking? Authorizing Provider  Azelastine -Fluticasone  137-50 MCG/ACT SUSP Place 1 spray into the nose every 12 (twelve) hours. 10/11/20   Joshua Debby CROME, MD  Cholecalciferol  (VITAMIN D3) 1.25 MG (50000 UT) CAPS TAKE 1 CAPSULE BY MOUTH ONE TIME PER WEEK 01/06/21   Joshua Debby CROME, MD  levothyroxine  (SYNTHROID ) 25 MCG tablet Take 1 tablet (25 mcg total) by mouth daily. 11/27/23   Diona Perkins, MD  olmesartan  (BENICAR ) 20 MG tablet Take 1 tablet (20 mg total) by mouth daily. 11/27/23   Diona Perkins, MD  polyethylene glycol powder (GLYCOLAX /MIRALAX ) 17 GM/SCOOP powder Take 17 g by mouth daily. 11/27/23   Diona Perkins, MD    Allergies: Penicillins and Sulfa antibiotics    Review of Systems  Constitutional:  Positive for diaphoresis and fatigue. Negative for fever.  Respiratory:  Negative for shortness of breath.   Cardiovascular:  Negative for chest pain.  Gastrointestinal:  Positive for abdominal distention, abdominal pain and nausea. Negative for blood in stool, diarrhea and vomiting.  Genitourinary:  Negative for dysuria and hematuria.    Updated Vital Signs BP (!) 145/97  (BP Location: Left Arm)   Pulse 78   Temp 97.9 F (36.6 C) (Oral)   Resp 18   Wt 104.3 kg   SpO2 98%   BMI 33.48 kg/m   Physical Exam Constitutional:      General: He is not in acute distress. Cardiovascular:     Rate and Rhythm: Normal rate and regular rhythm.     Heart sounds: Normal heart sounds.  Pulmonary:     Effort: Pulmonary effort is normal.     Breath sounds: Normal breath sounds.  Abdominal:     General: Abdomen is protuberant. Bowel sounds are normal.     Palpations: Abdomen is soft.     Tenderness: There is abdominal tenderness in the right lower quadrant, periumbilical area and left lower quadrant. There is no guarding or rebound.  Skin:    General: Skin is warm and dry.  Neurological:     Mental Status: He is alert.     (all labs ordered are listed, but only abnormal results are displayed) Labs Reviewed  COMPREHENSIVE METABOLIC PANEL WITH GFR - Abnormal; Notable for the following components:      Result Value   Sodium 134 (*)    Glucose, Bld 124 (*)    All other components within normal limits  CBC WITH DIFFERENTIAL/PLATELET - Abnormal; Notable for the following components:   Abs Immature Granulocytes 0.08 (*)    All other components within normal limits  LIPASE, BLOOD  URINALYSIS, ROUTINE W REFLEX MICROSCOPIC  EKG: None  Radiology: CT ABDOMEN PELVIS W CONTRAST Result Date: 11/27/2023 CLINICAL DATA:  Acute lower abdominal pain EXAM: CT ABDOMEN AND PELVIS WITH CONTRAST TECHNIQUE: Multidetector CT imaging of the abdomen and pelvis was performed using the standard protocol following bolus administration of intravenous contrast. RADIATION DOSE REDUCTION: This exam was performed according to the departmental dose-optimization program which includes automated exposure control, adjustment of the mA and/or kV according to patient size and/or use of iterative reconstruction technique. CONTRAST:  OMNIPAQUE  IOHEXOL  300 MG/ML  SOLN COMPARISON:  None  Available. FINDINGS: Lower chest: No suspicious findings at the lung bases. No pleural or pericardial effusion. Hepatobiliary: The liver demonstrates diffusely decreased density consistent with steatosis. No focal abnormalities are identified. No evidence of gallstones, gallbladder wall thickening or biliary dilatation. Pancreas: Unremarkable. No pancreatic ductal dilatation or surrounding inflammatory changes. Spleen: Normal in size without focal abnormality. Adrenals/Urinary Tract: Both adrenal glands appear normal. No evidence of urinary tract calculus, suspicious renal lesion or hydronephrosis. Simple appearing bilateral renal cysts, measuring up to 1.9 cm in the upper pole of the right kidney; no specific follow-up imaging recommended. The bladder appears unremarkable for its degree of distention. Stomach/Bowel: No enteric contrast administered. The stomach appears unremarkable for its degree of distension. No evidence of bowel wall thickening, distention or surrounding inflammatory change. The appendix appears normal. Mildly prominent stool throughout the colon. Vascular/Lymphatic: There are no enlarged abdominal or pelvic lymph nodes. No significant vascular findings. Reproductive: Unremarkable. Other: The prostate gland and seminal vesicles appear normal. Musculoskeletal: No acute or significant osseous findings. IMPRESSION: 1. No acute findings or explanation for the patient's symptoms. 2. Mildly prominent stool throughout the colon suggesting constipation. 3. Hepatic steatosis. Electronically Signed   By: Elsie Perone M.D.   On: 11/27/2023 11:27    Medications Ordered in the ED  iohexol  (OMNIPAQUE ) 300 MG/ML solution 100 mL (100 mLs Intravenous Contrast Given 11/27/23 1011)   Medical Decision Making Amount and/or Complexity of Data Reviewed Labs: ordered. Radiology: ordered.  Risk OTC drugs. Prescription drug management.   Seth Gonzales is a 53 y.o. male is here with abdominal pain.     Lab Tests:  CBC unremarkable  CMP Lipase UA unremarkable  Imaging Studies ordered:  CTA&P: Constipation, otherwise unremarkable   Plan Patient arriving from PCP for acute abdominal pain. CTAP with constipation, otherwise workup largely unremarkable. Discussed Miralax  daily, hydration, and other supportive measures at home. Also refilled patient home Olmesartan  and Synthroid  per request. Patient stable for discharge.    Final diagnoses:  Constipation, unspecified constipation type    ED Discharge Orders          Ordered    polyethylene glycol powder (GLYCOLAX /MIRALAX ) 17 GM/SCOOP powder  Daily,   Status:  Discontinued        11/27/23 1157    olmesartan  (BENICAR ) 20 MG tablet  Daily,   Status:  Discontinued        11/27/23 1157    levothyroxine  (SYNTHROID ) 25 MCG tablet  Daily,   Status:  Discontinued        11/27/23 1157    polyethylene glycol powder (GLYCOLAX /MIRALAX ) 17 GM/SCOOP powder  Daily        11/27/23 1204    olmesartan  (BENICAR ) 20 MG tablet  Daily        11/27/23 1204    levothyroxine  (SYNTHROID ) 25 MCG tablet  Daily        11/27/23 1204  Diona Perkins, MD 11/27/23 1225    Darra Fonda MATSU, MD 11/29/23 1116
